# Patient Record
Sex: Female | Born: 1975 | Race: White | Hispanic: No | Marital: Married | Smoking: Never smoker
Health system: Southern US, Community
[De-identification: ages and names within clinical notes are randomized; demographics above are authoritative.]

## PROBLEM LIST (undated history)

## (undated) DIAGNOSIS — F32A Depression, unspecified: Secondary | ICD-10-CM

## (undated) DIAGNOSIS — N6009 Solitary cyst of unspecified breast: Secondary | ICD-10-CM

## (undated) DIAGNOSIS — F419 Anxiety disorder, unspecified: Secondary | ICD-10-CM

## (undated) DIAGNOSIS — F329 Major depressive disorder, single episode, unspecified: Secondary | ICD-10-CM

## (undated) HISTORY — PX: BREAST CYST ASPIRATION: SHX578

## (undated) HISTORY — DX: Anxiety disorder, unspecified: F41.9

## (undated) HISTORY — DX: Solitary cyst of unspecified breast: N60.09

## (undated) HISTORY — DX: Depression, unspecified: F32.A

## (undated) HISTORY — DX: Major depressive disorder, single episode, unspecified: F32.9

---

## 2008-03-12 ENCOUNTER — Other Ambulatory Visit: Admission: RE | Admit: 2008-03-12 | Discharge: 2008-03-12 | Payer: Self-pay | Admitting: Family Medicine

## 2008-03-12 ENCOUNTER — Ambulatory Visit: Payer: Self-pay | Admitting: Family Medicine

## 2008-03-12 ENCOUNTER — Encounter: Payer: Self-pay | Admitting: Family Medicine

## 2008-03-12 DIAGNOSIS — F329 Major depressive disorder, single episode, unspecified: Secondary | ICD-10-CM

## 2008-04-03 ENCOUNTER — Ambulatory Visit: Payer: Self-pay | Admitting: Family Medicine

## 2008-04-03 DIAGNOSIS — B351 Tinea unguium: Secondary | ICD-10-CM

## 2008-05-13 ENCOUNTER — Encounter: Payer: Self-pay | Admitting: Family Medicine

## 2008-05-16 ENCOUNTER — Encounter: Payer: Self-pay | Admitting: Family Medicine

## 2009-04-29 ENCOUNTER — Encounter: Payer: Self-pay | Admitting: Family Medicine

## 2010-05-04 ENCOUNTER — Encounter: Payer: Self-pay | Admitting: Family Medicine

## 2010-12-06 NOTE — Letter (Signed)
[facility]   Imported By: Lanelle Bal 05/12/2010 13:24:30  _____________________________________________________________________  External Attachment:    Type:   Image     Comment:   External Document

## 2011-01-23 ENCOUNTER — Encounter: Payer: Self-pay | Admitting: Family Medicine

## 2011-01-24 ENCOUNTER — Encounter: Payer: Self-pay | Admitting: Family Medicine

## 2011-01-24 ENCOUNTER — Ambulatory Visit (INDEPENDENT_AMBULATORY_CARE_PROVIDER_SITE_OTHER): Payer: Self-pay | Admitting: Family Medicine

## 2011-01-24 ENCOUNTER — Ambulatory Visit: Payer: Self-pay | Admitting: Family Medicine

## 2011-01-24 VITALS — BP 112/67 | HR 64 | Ht 67.0 in | Wt 186.0 lb

## 2011-01-24 DIAGNOSIS — F329 Major depressive disorder, single episode, unspecified: Secondary | ICD-10-CM

## 2011-01-24 DIAGNOSIS — T148XXA Other injury of unspecified body region, initial encounter: Secondary | ICD-10-CM

## 2011-01-24 MED ORDER — VENLAFAXINE HCL ER 37.5 MG PO CP24: 37.5000 mg | ORAL_CAPSULE | Freq: Every day | ORAL | Status: DC

## 2011-01-24 NOTE — Progress Notes (Signed)
  Subjective:    Patient ID: LEGEND PECORE, female    DOB: 1976-07-30, 35 y.o.   MRN: [id number]  HPI  35yo WF presents for a fall down the stairs at home 1 wk ago.  She tripped over her pant leg, falling about 4 steps down, striking her R hip on the stairs.  Denies any head trauma or LOC.  Since then, she has had mild pain over the R femur/ hip with a large bruise.  Taking tyelnol and using ice.  Able to bear weight and walk w/o much pain.  Has some mild aching while sitting.    She also is interested in changing her fluoxetine back to cymbalta for her mood which was changed while she was pregnant 2.5 yrs ago.  She feels like the fluoxetine does not work as well but she is concerned about the cost for cymbalta.  Home life stable.  She is not breastfeeding.      Review of Systems  Musculoskeletal: Negative for gait problem.  Neurological: Negative for weakness and numbness.  Psychiatric/Behavioral: Negative for dysphoric mood.       Objective:   Physical Exam  Constitutional: She appears well-developed and well-nourished.  HENT:  Head: Normocephalic and atraumatic.  Neck: Neck supple.  Cardiovascular: Normal rate and regular rhythm.   Pulmonary/Chest: Effort normal and breath sounds normal. No respiratory distress.  Musculoskeletal:       Right hip: Normal.  Skin:          Large bruise w/ hematoma lateral/ posterior R proximal leg  Psychiatric: She has a normal mood and affect.          Assessment & Plan:  1. Bruise R hip/ leg x 1 wk after fall down the stairs.  Bruise appears to be healing and there is no sign of a hematoma. Full ROM w/o pain.  Will treat for soft tissue injury with ice and ibuprofen this week.  Expect to see improvements over the next week.    2.  Depression- She agrees to try generic effexor xr for mood due to cost savings from cymbalta and wanting to get back on SNRI which she did so well on in the past.  Start at 34.5 mg/ day x 1 wk then go up to 75 mg/  day.  Call if any problems.  RTC in 2 mos for f/u.

## 2011-01-24 NOTE — Patient Instructions (Signed)
Ice and Ibuprofen for bruise on R hip. This will improve over the next 10 days.  Start Venlaxafine 37.5 mg every AM x 7 days then go up to 2 capsules every morning.  Return for f/u mood in 6-8 wks.

## 2011-06-14 ENCOUNTER — Ambulatory Visit (INDEPENDENT_AMBULATORY_CARE_PROVIDER_SITE_OTHER): Payer: BC Managed Care – HMO | Admitting: Family Medicine

## 2011-06-14 ENCOUNTER — Other Ambulatory Visit (HOSPITAL_COMMUNITY)
Admission: RE | Admit: 2011-06-14 | Discharge: 2011-06-14 | Disposition: A | Discharge status: Home / Self Care | Payer: BC Managed Care – HMO | Source: Ambulatory Visit | Attending: Family Medicine | Admitting: Family Medicine

## 2011-06-14 ENCOUNTER — Encounter: Payer: Self-pay | Admitting: Family Medicine

## 2011-06-14 VITALS — BP 107/70 | HR 64 | Wt 179.0 lb

## 2011-06-14 DIAGNOSIS — Z0000 Encounter for general adult medical examination without abnormal findings: Secondary | ICD-10-CM

## 2011-06-14 DIAGNOSIS — Z01419 Encounter for gynecological examination (general) (routine) without abnormal findings: Secondary | ICD-10-CM | POA: Insufficient documentation

## 2011-06-14 MED ORDER — VENLAFAXINE HCL ER 150 MG PO CP24: 150.0000 mg | ORAL_CAPSULE | Freq: Every day | ORAL | Status: DC

## 2011-06-14 MED ORDER — TETANUS-DIPHTH-ACELL PERTUSSIS 5-2.5-18.5 LF-MCG/0.5 IM SUSP: 0.5000 mL | Freq: Once | INTRAMUSCULAR | Status: DC

## 2011-06-14 NOTE — Progress Notes (Signed)
  Subjective:    Patient ID: Amanda Ferrell, female    DOB: January 26, 1976, 35 y.o.   MRN: [id number]  HPI  35 yo WF presents for her annual PAP. She was previously seen by [facility].  No hx of abnormal pap.  She is married, monogamous.  She stopped her OCPs last month  Because her husband had a vasectomy.  She used to have heavy periods and bad cramping.  No fam hx of colon or breast cancer.  She does have fam hx of heart dz in her grandparents.  She has fasting labs done thru work next wk.  She is taking Venlafaxine for her mood because Cymbalta was too expensive.    BP 107/70  Pulse 64  Wt 179 lb (81.194 kg)  LMP 05/24/2011  Review of Systems  Constitutional: Negative for fatigue and unexpected weight change.  Eyes: Negative for visual disturbance.  Respiratory: Negative for shortness of breath.   Cardiovascular: Negative for chest pain, palpitations and leg swelling.  Gastrointestinal: Negative for diarrhea, constipation and blood in stool.  Genitourinary: Negative for difficulty urinating.  Psychiatric/Behavioral: Negative for sleep disturbance and dysphoric mood. The patient is not nervous/anxious.        Objective:   Physical Exam  Constitutional: She appears well-developed and well-nourished. No distress.       overwt  HENT:  Mouth/Throat: Oropharynx is clear and moist.  Neck: Neck supple. No thyromegaly present.  Cardiovascular: Normal rate, regular rhythm and normal heart sounds.   No murmur heard. Pulmonary/Chest: Effort normal and breath sounds normal. Right breast exhibits no mass, no nipple discharge and no skin change. Left breast exhibits no mass, no nipple discharge and no skin change.  Abdominal: Soft. Bowel sounds are normal. There is no tenderness.  Genitourinary: Uterus normal. Cervix exhibits no motion tenderness, no discharge and no friability. Right adnexum displays no mass. Left adnexum displays no mass and no tenderness.  Musculoskeletal: She exhibits no  edema.  Skin: Skin is warm and dry.  Psychiatric: She has a normal mood and affect.          Assessment & Plan:  CPE with pap:  F/u thin prep results next wk. Tdap updated today. Getting fasting labs thru work.  Will get a copy. Will go up on her venlafaxine dose and f/u for mood in 2 mos Work on [employer], regular exercise, MVI daily.

## 2011-06-14 NOTE — Patient Instructions (Signed)
Tdap today.  Will call you with pap smear results in the next week.  Fax or bring by a copy of your fasting labs you are doing this year.  Increase Venlafaxine to 150 mg daily.  Return for f/u mood in 2 mos.

## 2011-06-16 ENCOUNTER — Telehealth: Payer: Self-pay | Admitting: Family Medicine

## 2011-06-16 NOTE — Telephone Encounter (Signed)
Pls let pt know that her pap smear came back normal.  Repeat in 2 yrs. 

## 2011-06-19 NOTE — Telephone Encounter (Signed)
Pt aware of the above  

## 2011-06-28 ENCOUNTER — Telehealth: Payer: Self-pay | Admitting: Family Medicine

## 2011-06-28 MED ORDER — VENLAFAXINE HCL ER 75 MG PO CP24: 75.0000 mg | ORAL_CAPSULE | Freq: Every day | ORAL | Status: DC

## 2011-06-28 NOTE — Telephone Encounter (Signed)
Sure, if she felt better on 75 mg, will go ahead and fill this dose for her.

## 2011-06-28 NOTE — Telephone Encounter (Signed)
Pt called, but had to [facility] for her telling her that the effexor XR was decreased back down to the 75 mg, and to call the pharm later today to see if they have it ready for her to pup. Jarvis Newcomer, LPN [facility]

## 2011-06-28 NOTE — Telephone Encounter (Signed)
Pt called and said she was seen two weeks ago and they renewed her script for effexor XR but increased the dose to 150 mg. Pt is calling wanting to know if she can go back to using the 75 mg?  Please advise. Routed to Dr. Arlice Colt, LPN [facility]

## 2011-06-30 ENCOUNTER — Encounter: Payer: Self-pay | Admitting: Family Medicine

## 2011-06-30 ENCOUNTER — Ambulatory Visit (INDEPENDENT_AMBULATORY_CARE_PROVIDER_SITE_OTHER): Payer: BC Managed Care – HMO | Admitting: Family Medicine

## 2011-06-30 DIAGNOSIS — J069 Acute upper respiratory infection, unspecified: Secondary | ICD-10-CM

## 2011-06-30 DIAGNOSIS — F329 Major depressive disorder, single episode, unspecified: Secondary | ICD-10-CM

## 2011-06-30 MED ORDER — DULOXETINE HCL 30 MG PO CPEP: 30.0000 mg | ORAL_CAPSULE | Freq: Every day | ORAL | Status: DC

## 2011-06-30 NOTE — Patient Instructions (Signed)
Take your effexor every other day for 10 days.  In 5 days can start the cymbalta.  Call me if not better in 5 days.

## 2011-06-30 NOTE — Progress Notes (Signed)
  Subjective:    Patient ID: Amanda Ferrell, female    DOB: Oct 22, 1976, 35 y.o.   MRN: [id number]  HPI  sinsus pain and pressure for 5 days. Mild ST.  Lots of nasal congestion. No cough. No fever. No ear pain or pressure.  Teeth hurting.  Some sinus pressure.  Taking Tylenol sinus and helps some.    Recently switched to effexot. Used to be on cymbalta before got pregnant and it worked well but once pregnant was put on fluoxetine. After her pregnancy she felt it wasn't working well to changed to [employer]. She has been having more frequent HA on this and wonder is can go back to cymbalta.  Review of Systems     Objective:   Physical Exam  Constitutional: She is oriented to person, place, and time. She appears well-developed and well-nourished.  HENT:  Head: Normocephalic and atraumatic.  Right Ear: External ear normal.  Left Ear: External ear normal.  Nose: Nose normal.  Mouth/Throat: Oropharynx is clear and moist.       TMs and canals are clear.   Eyes: Conjunctivae and EOM are normal. Pupils are equal, round, and reactive to light.  Neck: Neck supple. No thyromegaly present.  Cardiovascular: Normal rate, regular rhythm and normal heart sounds.   Pulmonary/Chest: Effort normal and breath sounds normal. She has no wheezes.  Lymphadenopathy:    She has no cervical adenopathy.  Neurological: She is alert and oriented to person, place, and time.  Skin: Skin is warm and dry.  Psychiatric: She has a normal mood and affect.          Assessment & Plan:  Acute sinusitis - likely viral. reviwed with pt. H.o given on treatments that work. Call if not better in 5 days

## 2011-06-30 NOTE — Assessment & Plan Note (Signed)
Discussed options. She would like to change back to cymbalta. Will wean effexor and start the cymbalta. F/U in 4 -5 weeks and then can adjust dose etc.  She did really well on the cymbalta in the pst.

## 2011-07-04 ENCOUNTER — Telehealth: Payer: Self-pay | Admitting: Family Medicine

## 2011-07-04 MED ORDER — AZITHROMYCIN 250 MG PO TABS: ORAL_TABLET | ORAL | Status: AC

## 2011-07-04 NOTE — Telephone Encounter (Signed)
Pt called and said she was seen last Friday and at that point had been sick for 7 days.  Was told she had a sinus infection but because it had not been 10 days was not given a antibiotic Tx.  Was told to call back if she did not improve.  Pt still complaining of +pressure, +green nasal, and head congestion, ST, stuffy head. Allergic to PCN . PLAN:  Routed this information to Dr. Linford Arnold since the office note states for the pt to call in 5 days if not better. Jarvis Newcomer, LPN [facility]

## 2011-07-04 NOTE — Telephone Encounter (Signed)
Rx sent to [facility]

## 2011-07-04 NOTE — Telephone Encounter (Signed)
Pt notified that Zithromax prescription was sent to her pharmacy. Jarvis Newcomer, LPN [facility]

## 2011-07-20 ENCOUNTER — Encounter: Payer: Self-pay | Admitting: Family Medicine

## 2011-08-04 ENCOUNTER — Ambulatory Visit: Payer: BC Managed Care – HMO | Admitting: Family Medicine

## 2011-08-10 ENCOUNTER — Ambulatory Visit: Payer: BC Managed Care – HMO | Admitting: Family Medicine

## 2011-08-10 DIAGNOSIS — Z0289 Encounter for other administrative examinations: Secondary | ICD-10-CM

## 2011-11-21 ENCOUNTER — Encounter: Payer: Self-pay | Admitting: Family Medicine

## 2011-11-21 ENCOUNTER — Ambulatory Visit (INDEPENDENT_AMBULATORY_CARE_PROVIDER_SITE_OTHER): Payer: BC Managed Care – HMO | Admitting: Family Medicine

## 2011-11-21 VITALS — BP 112/78 | HR 82 | Temp 99.5°F | Wt 190.0 lb

## 2011-11-21 DIAGNOSIS — F329 Major depressive disorder, single episode, unspecified: Secondary | ICD-10-CM

## 2011-11-21 DIAGNOSIS — J329 Chronic sinusitis, unspecified: Secondary | ICD-10-CM

## 2011-11-21 MED ORDER — SERTRALINE HCL 50 MG PO TABS: ORAL_TABLET | ORAL | Status: DC

## 2011-11-21 MED ORDER — AZITHROMYCIN 250 MG PO TABS: ORAL_TABLET | ORAL | Status: AC

## 2011-11-21 NOTE — Patient Instructions (Signed)

## 2011-11-21 NOTE — Progress Notes (Signed)
  Subjective:    Patient ID: Amanda Ferrell, female    DOB: 1976-06-09, 36 y.o.   MRN: [id number]  HPI 6 days of sneezing and nasal congestion.  Seh wonders if may be allergies.  Throat and nose is itchy. A lot of facila pressure.  +post nasal drip. Nauseated.  Did start zyrtec a couple of times.  No alleviating sxs. Taking sinus OTC medication. Using cough syrup.    Depression - Switched herself back to effexor from the cymbalta since the cost when up..  But now when misses her dose she feels really dizzy.    Review of Systems     Objective:   Physical Exam  Constitutional: She is oriented to person, place, and time. She appears well-developed and well-nourished.  HENT:  Head: Normocephalic and atraumatic.  Right Ear: External ear normal.  Left Ear: External ear normal.  Nose: Nose normal.  Mouth/Throat: Oropharynx is clear and moist.       Both TMS are blocked by cerumen.   Eyes: Conjunctivae and EOM are normal. Pupils are equal, round, and reactive to light.  Neck: Neck supple. No thyromegaly present.  Cardiovascular: Normal rate, regular rhythm and normal heart sounds.   Pulmonary/Chest: Effort normal and breath sounds normal. She has no wheezes.  Lymphadenopathy:    She has no cervical adenopathy.  Neurological: She is alert and oriented to person, place, and time.  Skin: Skin is warm and dry.  Psychiatric: She has a normal mood and affect.          Assessment & Plan:  Depression - She was to get off the Effexor because of side effects when she accidentally misses a dose. She really likes the Cymbalta but can't afford the copayment. Will change her to sertraline. Followup in 3 or 4 weeks to see how she's doing on the medication. PHQ- 9 score of 2.    Sinusitis and possibly AR - will start zyrtec and veramyst treat for allergies. I will have her on a Z-Pak as well as she may have a secondary bacterial sinusitis. After she completes the antibiotic I would like her to  continue the allergy medications and see how well she is doing in 3-4 weeks.

## 2011-12-20 ENCOUNTER — Encounter: Payer: Self-pay | Admitting: *Deleted

## 2011-12-25 ENCOUNTER — Ambulatory Visit: Payer: BC Managed Care – HMO | Admitting: Family Medicine

## 2011-12-29 ENCOUNTER — Encounter: Payer: Self-pay | Admitting: *Deleted

## 2012-01-02 ENCOUNTER — Ambulatory Visit: Payer: BC Managed Care – HMO | Admitting: Family Medicine

## 2012-01-08 ENCOUNTER — Encounter: Payer: Self-pay | Admitting: Family Medicine

## 2012-01-08 ENCOUNTER — Ambulatory Visit (INDEPENDENT_AMBULATORY_CARE_PROVIDER_SITE_OTHER): Payer: BC Managed Care – HMO | Admitting: Family Medicine

## 2012-01-08 VITALS — BP 107/66 | HR 84 | Ht 68.0 in | Wt 197.0 lb

## 2012-01-08 DIAGNOSIS — N644 Mastodynia: Secondary | ICD-10-CM

## 2012-01-08 DIAGNOSIS — F329 Major depressive disorder, single episode, unspecified: Secondary | ICD-10-CM

## 2012-01-08 MED ORDER — SERTRALINE HCL 50 MG PO TABS: 50.0000 mg | ORAL_TABLET | Freq: Every day | ORAL | Status: DC

## 2012-01-08 MED ORDER — NORETHIN ACE-ETH ESTRAD-FE 1.5-30 MG-MCG PO TABS: 1.0000 | ORAL_TABLET | Freq: Every day | ORAL | Status: DC

## 2012-01-08 NOTE — Progress Notes (Signed)
  Subjective:    Patient ID: Amanda Ferrell, female    DOB: 11-23-1975, 36 y.o.   MRN: [id number]  HPI Depression f/u from 6 weeks ago. We switched her to sertraline. Doing well on it, no SE. Says wants to stay on current dose. Happy with it. Sleeping well. Says still has decreased pleasure in dong things.   She would also like to restart her birth control. She says she's had more symptoms as far as her periods, breast tenderness and feeling discomfort with ovulation. She came off of it about a year ago her husband had vasectomy but that she would like to get back on it for symptom control. She would like to start again this Sunday since she starts her period this week.  Review of Systems     Objective:   Physical Exam  Constitutional: She is oriented to person, place, and time. She appears well-developed and well-nourished.  HENT:  Head: Normocephalic and atraumatic.  Cardiovascular: Normal rate, regular rhythm and normal heart sounds.   Pulmonary/Chest: Effort normal and breath sounds normal.  Neurological: She is alert and oriented to person, place, and time.  Skin: Skin is warm and dry.  Psychiatric: She has a normal mood and affect. Her behavior is normal.          Assessment & Plan:  Depression - PHQ-9 score of 5 today, up from 2 compared to cymblata.  She is o/w doing well. Continue current regimen per her preference.  F/U in 3 months. Mail order rx sent.   Restart OCps for sxs control.

## 2012-04-09 ENCOUNTER — Ambulatory Visit: Payer: BC Managed Care – HMO | Admitting: Family Medicine

## 2012-04-23 ENCOUNTER — Ambulatory Visit: Payer: BC Managed Care – HMO | Admitting: Family Medicine

## 2013-05-13 ENCOUNTER — Encounter: Payer: Self-pay | Admitting: Family Medicine

## 2013-05-13 ENCOUNTER — Ambulatory Visit (INDEPENDENT_AMBULATORY_CARE_PROVIDER_SITE_OTHER): Payer: BC Managed Care – HMO | Admitting: Family Medicine

## 2013-05-13 VITALS — BP 121/80 | HR 71 | Wt 201.0 lb

## 2013-05-13 DIAGNOSIS — Z309 Encounter for contraceptive management, unspecified: Secondary | ICD-10-CM

## 2013-05-13 DIAGNOSIS — Z0000 Encounter for general adult medical examination without abnormal findings: Secondary | ICD-10-CM

## 2013-05-13 DIAGNOSIS — IMO0001 Reserved for inherently not codable concepts without codable children: Secondary | ICD-10-CM

## 2013-05-13 DIAGNOSIS — R21 Rash and other nonspecific skin eruption: Secondary | ICD-10-CM

## 2013-05-13 MED ORDER — NORETHIN ACE-ETH ESTRAD-FE 1.5-30 MG-MCG PO TABS: 1.0000 | ORAL_TABLET | Freq: Every day | ORAL | Status: DC

## 2013-05-13 NOTE — Patient Instructions (Signed)
Keep up a regular exercise program and make sure you are eating a healthy diet Try to eat 4 servings of dairy a day, or if you are lactose intolerant take a calcium with vitamin D daily.  Your vaccines are up to date.   

## 2013-05-13 NOTE — Progress Notes (Signed)
Subjective:     Amanda Ferrell is a 37 y.o. female and is here for a comprehensive physical exam. The patient reports problems - she would like to get back on birth control. last pap was last year and was normal. . She would like to restart birth control. Her husband had a vasectomy but she feels like when she was on birth control and help regulate her cycles and she did have a lot of premenstrual discomfort and bloating and cramping. She is tolerated it well the past without any side effects or problems.  She's also noticed a rash on the palm of her right hand. Has been there on and off for months. She says it will look like a bubble underneath the skin in that it would itch like crazy. She's been trying to moisturize it thinking it was some type of eczema. She denies any irritation or recurrent contact with chemicals et Karie Soda. She demonstrated persistent for a living. She is right handed.  History   Social History  . Marital Status: Married    Spouse Name: N/A    Number of Children: N/A  . Years of Education: N/A   Occupational History  . Not on file.   Social History Main Topics  . Smoking status: Never Smoker   . Smokeless tobacco: Not on file  . Alcohol Use: Yes     Comment: occasional  . Drug Use: No  . Sexually Active:    Other Topics Concern  . Not on file   Social History Narrative  . No narrative on file   Health Maintenance  Topic Date Due  . Influenza Vaccine  07/07/2013  . Pap Smear  06/13/2014  . Tetanus/tdap  06/13/2021    The following portions of the patient's history were reviewed and updated as appropriate: allergies, current medications, past family history, past medical history, past social history, past surgical history and problem list.  Review of Systems A comprehensive review of systems was negative.   Objective:    BP 121/80  Pulse 71  Wt 201 lb (91.173 kg)  BMI 30.57 kg/m2 General appearance: alert, cooperative and appears stated  age Head: Normocephalic, without obvious abnormality, atraumatic Eyes: conj clear, EOMi, PEERLA Ears: normal TM's and external ear canals both ears Nose: Nares normal. Septum midline. Mucosa normal. No drainage or sinus tenderness. Throat: lips, mucosa, and tongue normal; teeth and gums normal Neck: no adenopathy, no JVD, supple, symmetrical, trachea midline and thyroid not enlarged, symmetric, no tenderness/mass/nodules Back: symmetric, no curvature. ROM normal. No CVA tenderness. Lungs: clear to auscultation bilaterally Breasts: normal appearance, no masses or tenderness Heart: regular rate and rhythm, S1, S2 normal, no murmur, click, rub or gallop Abdomen: soft, non-tender; bowel sounds normal; no masses,  no organomegaly Extremities: extremities normal, atraumatic, no cyanosis or edema Pulses: 2+ and symmetric Skin: she has a clear vesicle with dry peeling skin at the center of the palm of the right hand.  Lymph nodes: Cervical, supraclavicular, and axillary nodes normal. Neurologic: Alert and oriented X 3, normal strength and tone. Normal symmetric reflexes. Normal coordination and gait    Assessment:    Healthy female exam.    Plan:     See After Visit Summary for Counseling Recommendations  Keep up a regular exercise program and make sure you are eating a healthy diet Try to eat 4 servings of dairy a day, or if you are lactose intolerant take a calcium with vitamin D daily.  Your vaccines are  up to date.   Keep up a regular exercise program and make sure you are eating a healthy diet  Rash on the palm of her right hand-most consistent with dyshidrotic eczema. Most likely will respond well to topical steroid cream. I did do a skin scraping to rule out any fungal elements first and if that is negative then I will send in a topical betamethasone ointment for her. Continue to keep it well moisturized.  Contraception-we'll restart birth control to control her cycles and  perimenstrual symptoms. Try to eat 4 servings of dairy a day, or if you are lactose intolerant take a calcium with vitamin D daily.  Your vaccines are up to date.

## 2013-05-14 ENCOUNTER — Other Ambulatory Visit: Payer: Self-pay | Admitting: Family Medicine

## 2013-05-14 MED ORDER — BETAMETHASONE DIPROPIONATE 0.05 % EX OINT: TOPICAL_OINTMENT | Freq: Two times a day (BID) | CUTANEOUS | Status: DC

## 2013-05-16 LAB — COMPLETE METABOLIC PANEL WITH GFR
AST: 16 U/L (ref 0–37)
Alkaline Phosphatase: 62 U/L (ref 39–117)
BUN: 11 mg/dL (ref 6–23)
Creat: 0.7 mg/dL (ref 0.50–1.10)
GFR, Est Non African American: >89 mL/min
Glucose, Bld: 88 mg/dL (ref 70–99)

## 2013-05-16 LAB — LIPID PANEL
Cholesterol: 168 mg/dL (ref 0–200)
Total CHOL/HDL Ratio: 3.1 Ratio
Triglycerides: 61 mg/dL (ref <150)
VLDL: 12 mg/dL (ref 0–40)

## 2013-05-16 NOTE — Progress Notes (Signed)
Quick Note:  All labs are normal. ______ 

## 2013-09-19 ENCOUNTER — Encounter: Payer: Self-pay | Admitting: Sports Medicine

## 2013-09-19 ENCOUNTER — Ambulatory Visit (INDEPENDENT_AMBULATORY_CARE_PROVIDER_SITE_OTHER): Payer: BC Managed Care – HMO | Admitting: Sports Medicine

## 2013-09-19 VITALS — BP 135/85 | HR 106 | Wt 204.0 lb

## 2013-09-19 DIAGNOSIS — N390 Urinary tract infection, site not specified: Secondary | ICD-10-CM

## 2013-09-19 DIAGNOSIS — N3090 Cystitis, unspecified without hematuria: Secondary | ICD-10-CM

## 2013-09-19 LAB — POCT URINALYSIS DIPSTICK
Bilirubin, UA: NEGATIVE
Glucose, UA: NEGATIVE
Ketones, UA: NEGATIVE
Nitrite, UA: NEGATIVE
Spec Grav, UA: 1.025
Urobilinogen, UA: 0.2
pH, UA: 6.5

## 2013-09-19 MED ORDER — CEPHALEXIN 500 MG PO CAPS: 500.0000 mg | ORAL_CAPSULE | Freq: Two times a day (BID) | ORAL | Status: DC

## 2013-09-19 MED ORDER — PHENAZOPYRIDINE HCL 200 MG PO TABS: 200.0000 mg | ORAL_TABLET | Freq: Three times a day (TID) | ORAL | Status: AC

## 2013-09-19 NOTE — Assessment & Plan Note (Signed)
Urine culture, Keflex (non-anaphylactic response to penicillin as a child), Pyridium. Return as needed.

## 2013-09-19 NOTE — Patient Instructions (Signed)
Urinary Tract Infection  Urinary tract infections (UTIs) can develop anywhere along your urinary tract. Your urinary tract is your body's drainage system for removing wastes and extra water. Your urinary tract includes two kidneys, two ureters, a bladder, and a urethra. Your kidneys are a pair of bean-shaped organs. Each kidney is about the size of your fist. They are located below your ribs, one on each side of your spine.  CAUSES  Infections are caused by microbes, which are microscopic organisms, including fungi, viruses, and bacteria. These organisms are so small that they can only be seen through a microscope. Bacteria are the microbes that most commonly cause UTIs.  SYMPTOMS   Symptoms of UTIs may vary by age and gender of the patient and by the location of the infection. Symptoms in young women typically include a frequent and intense urge to urinate and a painful, burning feeling in the bladder or urethra during urination. Older women and men are more likely to be tired, shaky, and weak and have muscle aches and abdominal pain. A fever may mean the infection is in your kidneys. Other symptoms of a kidney infection include pain in your back or sides below the ribs, nausea, and vomiting.  DIAGNOSIS  To diagnose a UTI, your caregiver will ask you about your symptoms. Your caregiver also will ask to provide a urine sample. The urine sample will be tested for bacteria and white blood cells. White blood cells are made by your body to help fight infection.  TREATMENT   Typically, UTIs can be treated with medication. Because most UTIs are caused by a bacterial infection, they usually can be treated with the use of antibiotics. The choice of antibiotic and length of treatment depend on your symptoms and the type of bacteria causing your infection.  HOME CARE INSTRUCTIONS   If you were prescribed antibiotics, take them exactly as your caregiver instructs you. Finish the medication even if you feel better after you  have only taken some of the medication.   Drink enough water and fluids to keep your urine clear or pale yellow.   Avoid caffeine, tea, and carbonated beverages. They tend to irritate your bladder.   Empty your bladder often. Avoid holding urine for long periods of time.   Empty your bladder before and after sexual intercourse.   After a bowel movement, women should cleanse from front to back. Use each tissue only once.  SEEK MEDICAL CARE IF:    You have back pain.   You develop a fever.   Your symptoms do not begin to resolve within 3 days.  SEEK IMMEDIATE MEDICAL CARE IF:    You have severe back pain or lower abdominal pain.   You develop chills.   You have nausea or vomiting.   You have continued burning or discomfort with urination.  MAKE SURE YOU:    Understand these instructions.   Will watch your condition.   Will get help right away if you are not doing well or get worse.  Document Released: 08/02/2005 Document Revised: 04/23/2012 Document Reviewed: 12/01/2011  [employer] Patient Information 2014 [employer].

## 2013-09-19 NOTE — Progress Notes (Signed)
  Subjective:    CC: Dysuria  HPI: This pleasant 37 year old female comes in with a several day history of burning when she urinates, urgency, frequency, no fevers, chills, no flank pain. No rashes. No vaginal discharge. Symptoms are moderate, persistent.  Past medical history, Surgical history, Family history not pertinant except as noted below, Social history, Allergies, and medications have been entered into the medical record, reviewed, and no changes needed.   Review of Systems: No fevers, chills, night sweats, weight loss, chest pain, or shortness of breath.   Objective:    General: Well Developed, well nourished, and in no acute distress.  Neuro: Alert and oriented x3, extra-ocular muscles intact, sensation grossly intact.  HEENT: Normocephalic, atraumatic, pupils equal round reactive to light, neck supple, no masses, no lymphadenopathy, thyroid nonpalpable.  Skin: Warm and dry, no rashes. Cardiac: Regular rate and rhythm, no murmurs rubs or gallops, no lower extremity edema.  Respiratory: Clear to auscultation bilaterally. Not using accessory muscles, speaking in full sentences. Abdomen: Soft, minimally tender to palpation over the suprapubic region, no costovertebral angle pain.  Urinalysis was reviewed and shows leukocytes and blood.  Impression and Recommendations:

## 2013-09-22 LAB — URINE CULTURE: Culture: 75000

## 2013-11-18 ENCOUNTER — Encounter: Payer: Self-pay | Admitting: Physician Assistant

## 2013-11-18 ENCOUNTER — Ambulatory Visit (INDEPENDENT_AMBULATORY_CARE_PROVIDER_SITE_OTHER): Payer: Managed Care, Other (non HMO) | Admitting: Physician Assistant

## 2013-11-18 VITALS — BP 121/81 | HR 90 | Temp 98.8°F | Wt 206.0 lb

## 2013-11-18 DIAGNOSIS — H6692 Otitis media, unspecified, left ear: Secondary | ICD-10-CM

## 2013-11-18 DIAGNOSIS — H6123 Impacted cerumen, bilateral: Secondary | ICD-10-CM

## 2013-11-18 DIAGNOSIS — J019 Acute sinusitis, unspecified: Secondary | ICD-10-CM

## 2013-11-18 DIAGNOSIS — H6690 Otitis media, unspecified, unspecified ear: Secondary | ICD-10-CM

## 2013-11-18 DIAGNOSIS — H6120 Impacted cerumen, unspecified ear: Secondary | ICD-10-CM

## 2013-11-18 MED ORDER — FLUTICASONE PROPIONATE 50 MCG/ACT NA SUSP: 2.0000 | Freq: Every day | NASAL | Status: DC

## 2013-11-18 MED ORDER — CEFDINIR 300 MG PO CAPS: 300.0000 mg | ORAL_CAPSULE | Freq: Two times a day (BID) | ORAL | Status: DC

## 2013-11-18 NOTE — Patient Instructions (Signed)

## 2013-11-18 NOTE — Progress Notes (Signed)
   Subjective:    Patient ID: Amanda Ferrell, female    DOB: December 26, 1975, 38 y.o.   MRN: [id number]  HPI Patient is a 38 year old female who presents to the clinic with sore throat, headache, nasal congestion and fever for the last 2 days. She was sick around Christmas with sinus pressure symptoms. They eventually went away with symptomatic care. She had a week of relief and she started feeling her congestion, back. Last night it reached a peak and she started having body aches, chills and a temperature of 100.9. She feels a lot of ear pressure. She does have some sore throat. She feels a lot of drainage coming down to the back of her throat. She has not tried anything but ibuprofen and Tylenol for fever. She denies any significant cough or wheezing. She does feel like her chest is tight.    Review of Systems     Objective:   Physical Exam  HENT:  Head: Normocephalic and atraumatic.  Bilateral ears were impacted with cerumen. After irrigation and significant cerumen removal I was still unable to view TMs and right ear due to impaction and only had a partial view of TM in left ear. From what I did see in left ear seemed goal with significant fluid behind and erythema surrounding. There was significant discharge that appears white and frothy coming from lack ear. It is possible some of this was 2 to cerumen.  Oropharynx was erythematous with post nasal drip present.  Maxillary tenderness to palpation bilaterally.  Eyes: Conjunctivae are normal. Right eye exhibits no discharge. Left eye exhibits no discharge.  Neck: Normal range of motion. Neck supple.  Bilateral anterior cervical lymph nodes were enlarged but not tender to palpation.  Cardiovascular: Normal rate, regular rhythm and normal heart sounds.   Pulmonary/Chest: Effort normal and breath sounds normal. She has no wheezes.  Skin: Skin is warm and dry.  Psychiatric: She has a normal mood and affect. Her behavior is normal.          Assessment & Plan:  Left otitis media/sinusitis/bilateral cerumen impaction-I decided to treat with Omnicef today, where saturated of of penicillin however patient is unable to recall penicillin allergy as it happened as a young child. She was instructed that if she developed any rash or unusual symptoms to take the Benadryl stop medication and call office. I also sent Flonase to clinic to use daily as needed. Patient had significant cerumen in the ears today. Instructed patient that the ear irrigation today hopefully will help to loosen as well as using the Debrox drops to get out. If unable to clean ears out we can send to ENT for removal.

## 2014-04-30 ENCOUNTER — Other Ambulatory Visit: Payer: Self-pay | Admitting: Family Medicine

## 2014-05-25 ENCOUNTER — Encounter: Payer: Managed Care, Other (non HMO) | Admitting: Family Medicine

## 2014-06-18 ENCOUNTER — Ambulatory Visit (INDEPENDENT_AMBULATORY_CARE_PROVIDER_SITE_OTHER): Payer: Managed Care, Other (non HMO) | Admitting: Family Medicine

## 2014-06-18 ENCOUNTER — Other Ambulatory Visit (HOSPITAL_COMMUNITY)
Admission: RE | Admit: 2014-06-18 | Discharge: 2014-06-18 | Disposition: A | Discharge status: Home / Self Care | Payer: Managed Care, Other (non HMO) | Source: Ambulatory Visit | Attending: Family Medicine | Admitting: Family Medicine

## 2014-06-18 ENCOUNTER — Encounter: Payer: Self-pay | Admitting: Family Medicine

## 2014-06-18 VITALS — BP 116/66 | HR 74 | Ht 68.0 in | Wt 203.0 lb

## 2014-06-18 DIAGNOSIS — Z01419 Encounter for gynecological examination (general) (routine) without abnormal findings: Secondary | ICD-10-CM | POA: Diagnosis not present

## 2014-06-18 DIAGNOSIS — H6120 Impacted cerumen, unspecified ear: Secondary | ICD-10-CM

## 2014-06-18 DIAGNOSIS — Z1151 Encounter for screening for human papillomavirus (HPV): Secondary | ICD-10-CM | POA: Insufficient documentation

## 2014-06-18 DIAGNOSIS — H6123 Impacted cerumen, bilateral: Secondary | ICD-10-CM

## 2014-06-18 DIAGNOSIS — Z0000 Encounter for general adult medical examination without abnormal findings: Secondary | ICD-10-CM

## 2014-06-18 DIAGNOSIS — Z124 Encounter for screening for malignant neoplasm of cervix: Secondary | ICD-10-CM

## 2014-06-18 MED ORDER — NORETHIN ACE-ETH ESTRAD-FE 1.5-30 MG-MCG PO TABS: ORAL_TABLET | ORAL | Status: DC

## 2014-06-18 NOTE — Progress Notes (Signed)
  Subjective:     Amanda Ferrell is a 38 y.o. female and is here for a comprehensive physical exam. The patient reports no problems.  History   Social History  . Marital Status: Married    Spouse Name: N/A    Number of Children: 2  . Years of Education: N/A   Occupational History  . Admin Asst     Social History Main Topics  . Smoking status: Never Smoker   . Smokeless tobacco: Not on file  . Alcohol Use: Yes     Comment: occasional  . Drug Use: No  . Sexual Activity: Yes    Partners: Male    Birth Control/ Protection: Other-see comments     Comment: vasectomey   Other Topics Concern  . Not on file   Social History Narrative   No regular exercise.    Health Maintenance  Topic Date Due  . Pap Smear  06/13/2014  . Influenza Vaccine  06/19/2015 (Originally 06/06/2014)  . Tetanus/tdap  06/13/2021    The following portions of the patient's history were reviewed and updated as appropriate: allergies, current medications, past family history, past medical history, past social history, past surgical history and problem list.  Review of Systems A comprehensive review of systems was negative.   Objective:    BP 116/66  Pulse 74  Ht 5\' 8"  (1.727 m)  Wt 203 lb (92.08 kg)  BMI 30.87 kg/m2 General appearance: alert, cooperative and appears stated age Head: Normocephalic, without obvious abnormality, atraumatic Eyes: conj clear, EOMI, PEERLA Ears: normal TM's and external ear canals both ears Nose: Nares normal. Septum midline. Mucosa normal. No drainage or sinus tenderness. Throat: lips, mucosa, and tongue normal; teeth and gums normal Neck: no adenopathy, no carotid bruit, no JVD, supple, symmetrical, trachea midline and thyroid not enlarged, symmetric, no tenderness/mass/nodules Back: symmetric, no curvature. ROM normal. No CVA tenderness. Lungs: clear to auscultation bilaterally Breasts: normal appearance, no masses or tenderness Heart: regular rate and rhythm, S1, S2  normal, no murmur, click, rub or gallop Abdomen: soft, non-tender; bowel sounds normal; no masses,  no organomegaly Pelvic: cervix normal in appearance, external genitalia normal, no adnexal masses or tenderness, no cervical motion tenderness, rectovaginal septum normal, uterus normal size, shape, and consistency and vagina normal without discharge Extremities: extremities normal, atraumatic, no cyanosis or edema Pulses: 2+ and symmetric Skin: Skin color, texture, turgor normal. No rashes or lesions Lymph nodes: Cervical, supraclavicular, and axillary nodes normal. Neurologic: Alert and oriented X 3, normal strength and tone. Normal symmetric reflexes. Normal coordination and gait    Assessment:    Healthy female exam.      Plan:     See After Visit Summary for Counseling Recommendations  Keep up a regular exercise program and make sure you are eating a healthy diet Try to eat 4 servings of dairy a day, or if you are lactose intolerant take a calcium with vitamin D daily.  Your vaccines are up to date.  Will call about the results once available Due for CMP and fasting lipid panel. Lab slip today. Declined flu vaccine this year.

## 2014-06-22 LAB — CYTOLOGY - PAP

## 2014-06-25 ENCOUNTER — Other Ambulatory Visit: Payer: Self-pay | Admitting: Family Medicine

## 2014-06-25 MED ORDER — FLUCONAZOLE 150 MG PO TABS: 150.0000 mg | ORAL_TABLET | Freq: Every day | ORAL | Status: DC

## 2015-07-11 ENCOUNTER — Other Ambulatory Visit: Payer: Self-pay | Admitting: Family Medicine

## 2015-07-13 NOTE — Telephone Encounter (Signed)
Pt scheduled for her physical with PCP.

## 2015-08-02 ENCOUNTER — Encounter: Payer: Managed Care, Other (non HMO) | Admitting: Family Medicine

## 2015-08-10 ENCOUNTER — Encounter: Payer: Self-pay | Admitting: Family Medicine

## 2015-08-10 ENCOUNTER — Ambulatory Visit (INDEPENDENT_AMBULATORY_CARE_PROVIDER_SITE_OTHER): Payer: Managed Care, Other (non HMO) | Admitting: Family Medicine

## 2015-08-10 VITALS — BP 120/57 | HR 67 | Wt 203.0 lb

## 2015-08-10 DIAGNOSIS — Z0000 Encounter for general adult medical examination without abnormal findings: Secondary | ICD-10-CM | POA: Diagnosis not present

## 2015-08-10 DIAGNOSIS — K219 Gastro-esophageal reflux disease without esophagitis: Secondary | ICD-10-CM

## 2015-08-10 MED ORDER — NORETHIN ACE-ETH ESTRAD-FE 1.5-30 MG-MCG PO TABS: 1.0000 | ORAL_TABLET | Freq: Every day | ORAL | Status: DC

## 2015-08-10 NOTE — Patient Instructions (Signed)
Keep up a regular exercise program and make sure you are eating a healthy diet Try to eat 4 servings of dairy a day, or if you are lactose intolerant take a calcium with vitamin D daily.  Your vaccines are up to date.   

## 2015-08-10 NOTE — Progress Notes (Signed)
  Subjective:     Amanda Ferrell is a 39 y.o. female and is here for a comprehensive physical exam. The patient reports problems - cough after she eats.  comes and goes.  did buy and acid reducer and started it yesteday . no GERD or heartburn.   she is happy with her birth control. Pap smear is up-to-date and was normal last year. Not due for repeat until 2018.  Social History   Social History  . Marital Status: Married    Spouse Name: N/A  . Number of Children: 2  . Years of Education: N/A   Occupational History  . Admin Asst     Social History Main Topics  . Smoking status: Never Smoker   . Smokeless tobacco: Not on file  . Alcohol Use: Yes     Comment: occasional  . Drug Use: No  . Sexual Activity:    Partners: Male    Birth Control/ Protection: Other-see comments     Comment: vasectomey   Other Topics Concern  . Not on file   Social History Narrative   No regular exercise.    Health Maintenance  Topic Date Due  . HIV Screening  11/03/1992  . INFLUENZA VACCINE  06/07/2015  . PAP SMEAR  06/18/2017  . TETANUS/TDAP  06/13/2021    The following portions of the patient's history were reviewed and updated as appropriate: allergies, current medications, past family history, past medical history, past social history, past surgical history and problem list.  Review of Systems Pertinent items noted in HPI and remainder of comprehensive ROS otherwise negative.   Objective:    There were no vitals taken for this visit. General appearance: alert, cooperative and appears stated age Head: Normocephalic, without obvious abnormality, atraumatic Eyes: conj clear, EOMI, PEERLA Ears: normal TM's and external ear canals both ears Nose: Nares normal. Septum midline. Mucosa normal. No drainage or sinus tenderness. Throat: lips, mucosa, and tongue normal; teeth and gums normal Neck: no adenopathy, no carotid bruit, no JVD, supple, symmetrical, trachea midline and thyroid not  enlarged, symmetric, no tenderness/mass/nodules Back: symmetric, no curvature. ROM normal. No CVA tenderness. Lungs: clear to auscultation bilaterally Breasts: normal appearance, no masses or tenderness Heart: regular rate and rhythm, S1, S2 normal, no murmur, click, rub or gallop Abdomen: soft, non-tender; bowel sounds normal; no masses,  no organomegaly Extremities: extremities normal, atraumatic, no cyanosis or edema Pulses: 2+ and symmetric Skin: Skin color, texture, turgor normal. No rashes or lesions Lymph nodes: Cervical, supraclavicular, and axillary nodes normal. Neurologic: Alert and oriented X 3, normal strength and tone. Normal symmetric reflexes. Normal coordination and gait    Assessment:    Healthy female exam.      Plan:     See After Visit Summary for Counseling Recommendations  Keep up a regular exercise program and make sure you are eating a healthy diet Try to eat 4 servings of dairy a day, or if you are lactose intolerant take a calcium with vitamin D daily.  Your vaccines are up to date.    GERD - she is going to try an OTC acid reducer.   If the cough doesn't improve after eating then please let me know. We cannot try a PPI.   Declines flu vaccine today.

## 2015-08-20 LAB — COMPLETE METABOLIC PANEL WITH GFR
ALBUMIN: 3.5 g/dL — AB (ref 3.6–5.1)
ALT: 15 U/L (ref 6–29)
AST: 18 U/L (ref 10–30)
Alkaline Phosphatase: 54 U/L (ref 33–115)
BUN: 9 mg/dL (ref 7–25)
CHLORIDE: 102 mmol/L (ref 98–110)
CO2: 28 mmol/L (ref 20–31)
Calcium: 8.5 mg/dL — ABNORMAL LOW (ref 8.6–10.2)
Creat: 0.55 mg/dL (ref 0.50–1.10)
GFR, Est African American: >89 mL/min (ref >=60)
GFR, Est Non African American: >89 mL/min (ref >=60)
GLUCOSE: 78 mg/dL (ref 65–99)
POTASSIUM: 4.1 mmol/L (ref 3.5–5.3)
SODIUM: 137 mmol/L (ref 135–146)
Total Bilirubin: 0.4 mg/dL (ref 0.2–1.2)
Total Protein: 5.9 g/dL — ABNORMAL LOW (ref 6.1–8.1)

## 2015-08-20 LAB — LIPID PANEL
Cholesterol: 172 mg/dL (ref 125–200)
HDL: 59 mg/dL (ref >=46)
LDL Cholesterol: 100 mg/dL (ref <130)
Total CHOL/HDL Ratio: 2.9 Ratio (ref <=5.0)
Triglycerides: 64 mg/dL (ref <150)
VLDL: 13 mg/dL (ref <30)

## 2015-08-26 ENCOUNTER — Other Ambulatory Visit: Payer: Self-pay | Admitting: *Deleted

## 2015-08-26 DIAGNOSIS — R770 Abnormality of albumin: Secondary | ICD-10-CM

## 2016-06-12 ENCOUNTER — Other Ambulatory Visit: Payer: Self-pay | Admitting: *Deleted

## 2016-06-12 MED ORDER — NORETHIN ACE-ETH ESTRAD-FE 1.5-30 MG-MCG PO TABS: 1.0000 | ORAL_TABLET | Freq: Every day | ORAL | 3 refills | Status: DC

## 2017-03-14 LAB — HEPATIC FUNCTION PANEL
ALK PHOS: 67 (ref 25–125)
ALT: 25 (ref 7–35)
AST: 23 (ref 13–35)
BILIRUBIN, TOTAL: 0.3

## 2017-03-14 LAB — CBC AND DIFFERENTIAL
HCT: 42 (ref 36–46)
HEMOGLOBIN: 13.2 (ref 12.0–16.0)
PLATELETS: 319 (ref 150–399)
WBC: 6.8

## 2017-03-14 LAB — HEMOGLOBIN A1C: HEMOGLOBIN A1C: 5

## 2017-03-14 LAB — LIPID PANEL
Cholesterol: 181 (ref 0–200)
HDL: 63 (ref 35–70)
LDL Cholesterol: 105

## 2017-03-14 LAB — BASIC METABOLIC PANEL
BUN: 9 (ref 4–21)
CREATININE: 0.7 (ref 0.5–1.1)
GLUCOSE: 90

## 2017-03-14 LAB — TSH: TSH: 1.68 (ref 0.41–5.90)

## 2017-05-27 ENCOUNTER — Other Ambulatory Visit: Payer: Self-pay | Admitting: Family Medicine

## 2017-05-28 ENCOUNTER — Telehealth: Payer: Self-pay

## 2017-05-28 NOTE — Telephone Encounter (Signed)
Pt requested a refill of birth control. I declined that request due to her not being seen in office since 08/10/15. She has appointment tomorrow. -EH/RMA

## 2017-05-29 ENCOUNTER — Encounter: Payer: Self-pay | Admitting: Family Medicine

## 2017-05-29 ENCOUNTER — Ambulatory Visit (INDEPENDENT_AMBULATORY_CARE_PROVIDER_SITE_OTHER): Payer: Managed Care, Other (non HMO) | Admitting: Family Medicine

## 2017-05-29 VITALS — BP 125/70 | HR 64 | Ht 67.0 in | Wt 191.0 lb

## 2017-05-29 DIAGNOSIS — K2100 Gastro-esophageal reflux disease with esophagitis, without bleeding: Secondary | ICD-10-CM

## 2017-05-29 DIAGNOSIS — Z0000 Encounter for general adult medical examination without abnormal findings: Secondary | ICD-10-CM

## 2017-05-29 MED ORDER — NORETHIN ACE-ETH ESTRAD-FE 1.5-30 MG-MCG PO TABS: 1.0000 | ORAL_TABLET | Freq: Every day | ORAL | 3 refills | Status: DC

## 2017-05-29 MED ORDER — OMEPRAZOLE 40 MG PO CPDR: 40.0000 mg | DELAYED_RELEASE_CAPSULE | Freq: Every day | ORAL | 2 refills | Status: DC

## 2017-05-29 NOTE — Progress Notes (Signed)
Subjective:     Amanda Ferrell Whichard is a 41 y.o. female and is here for a comprehensive physical exam. The patient reports problems - reflux.She says she often uses ranitidine and says sometimes it works but sometimes it doesn't. She often will get stomach contents coming back up into her throat.  Social History   Social History  . Marital status: Married    Spouse name: N/A  . Number of children: 2  . Years of education: N/A   Occupational History  . Admin Asst     Social History Main Topics  . Smoking status: Never Smoker  . Smokeless tobacco: Never Used  . Alcohol use Yes     Comment: occasional  . Drug use: No  . Sexual activity: Yes    Partners: Male    Birth control/ protection: Other-see comments     Comment: vasectomey   Other Topics Concern  . Not on file   Social History Narrative   No regular exercise.    Health Maintenance  Topic Date Due  . HIV Screening  11/03/1992  . INFLUENZA VACCINE  06/06/2017  . PAP SMEAR  06/19/2019  . TETANUS/TDAP  06/13/2021    The following portions of the patient's history were reviewed and updated as appropriate: allergies, current medications, past family history, past medical history, past social history, past surgical history and problem list.  Review of Systems A comprehensive review of systems was negative.   Objective:    BP 125/70   Pulse 64   Ht 5\' 7"  (1.702 m)   Wt 191 lb (86.6 kg)   BMI 29.91 kg/m  General appearance: alert, cooperative and appears stated age Head: Normocephalic, without obvious abnormality, atraumatic Eyes: conj clear, EOMI, PEERLA Ears: normal TM's and external ear canals both ears Nose: Nares normal. Septum midline. Mucosa normal. No drainage or sinus tenderness. Throat: lips, mucosa, and tongue normal; teeth and gums normal Neck: no adenopathy, no carotid bruit, no JVD, supple, symmetrical, trachea midline and thyroid not enlarged, symmetric, no tenderness/mass/nodules Back: symmetric, no  curvature. ROM normal. No CVA tenderness. Lungs: clear to auscultation bilaterally Breasts: normal appearance, no masses or tenderness Heart: regular rate and rhythm, S1, S2 normal, no murmur, click, rub or gallop Abdomen: soft, non-tender; bowel sounds normal; no masses,  no organomegaly Extremities: extremities normal, atraumatic, no cyanosis or edema Pulses: 2+ and symmetric Skin: Skin color, texture, turgor normal. No rashes or lesions Lymph nodes: Cervical, supraclavicular, and axillary nodes normal. Neurologic: Alert and oriented X 3, normal strength and tone. Normal symmetric reflexes. Normal coordination and gait    Assessment:    Healthy female exam.      Plan:     See After Visit Summary for Counseling Recommendations   Keep up a regular exercise program and make sure you are eating a healthy diet Try to eat 4 servings of dairy a day, or if you are lactose intolerant take a calcium with vitamin D daily.  Your vaccines are up to date.   GERD with esophagitis-we'll switch to omeprazole 40 mg for 2 months. She is doing well Point encourage her to drop back down to 20 mg and then eventually weaned back down to the ranitidine. Also reviewed dietary measures.  RF her birth control.   She also brought in some lab work that she  did through work for biometric screen. We will get this injected into the chart.

## 2017-05-29 NOTE — Patient Instructions (Addendum)
Food Choices for Gastroesophageal Reflux Disease, Adult When you have gastroesophageal reflux disease (GERD), the foods you eat and your eating habits are very important. Choosing the right foods can help ease your discomfort. What guidelines do I need to follow?  Choose fruits, vegetables, whole grains, and low-fat dairy products.  Choose low-fat meat, fish, and poultry.  Limit fats such as oils, salad dressings, butter, nuts, and avocado.  Keep a food diary. This helps you identify foods that cause symptoms.  Avoid foods that cause symptoms. These may be different for everyone.  Eat small meals often instead of 3 large meals a day.  Eat your meals slowly, in a place where you are relaxed.  Limit fried foods.  Cook foods using methods other than frying.  Avoid drinking alcohol.  Avoid drinking large amounts of liquids with your meals.  Avoid bending over or lying down until 2-3 hours after eating. What foods are not recommended? These are some foods and drinks that may make your symptoms worse: Vegetables  Tomatoes. Tomato juice. Tomato and spaghetti sauce. Chili peppers. Onion and garlic. Horseradish. Fruits  Oranges, grapefruit, and lemon (fruit and juice). Meats  High-fat meats, fish, and poultry. This includes hot dogs, ribs, ham, sausage, salami, and bacon. Dairy  Whole milk and chocolate milk. Sour cream. Cream. Butter. Ice cream. Cream cheese. Drinks  Coffee and tea. Bubbly (carbonated) drinks or energy drinks. Condiments  Hot sauce. Barbecue sauce. Sweets/Desserts  Chocolate and cocoa. Donuts. Peppermint and spearmint. Fats and Oils  High-fat foods. This includes French fries and potato chips. Other  Vinegar. Strong spices. This includes black pepper, white pepper, red pepper, cayenne, curry powder, cloves, ginger, and chili powder. The items listed above may not be a complete list of foods and drinks to avoid. Contact your dietitian for more information.    This information is not intended to replace advice given to you by your health care provider. Make sure you discuss any questions you have with your health care provider. Document Released: 04/23/2012 Document Revised: 03/30/2016 Document Reviewed: 08/27/2013 Elsevier Interactive Patient Education  2017 Elsevier Inc.  

## 2017-06-08 ENCOUNTER — Encounter: Payer: Self-pay | Admitting: Family Medicine

## 2017-12-27 ENCOUNTER — Telehealth: Payer: Self-pay | Admitting: Family Medicine

## 2017-12-27 DIAGNOSIS — Z1239 Encounter for other screening for malignant neoplasm of breast: Secondary | ICD-10-CM

## 2017-12-27 NOTE — Telephone Encounter (Signed)
Order placed  Pt advised

## 2017-12-27 NOTE — Telephone Encounter (Signed)
Pt would like to go ahead and get a mammogram order put in so she can get it done now that she is 41.Thanks

## 2018-01-04 ENCOUNTER — Ambulatory Visit (INDEPENDENT_AMBULATORY_CARE_PROVIDER_SITE_OTHER): Payer: BLUE CROSS/BLUE SHIELD

## 2018-01-04 DIAGNOSIS — Z1239 Encounter for other screening for malignant neoplasm of breast: Secondary | ICD-10-CM

## 2018-01-04 DIAGNOSIS — Z1231 Encounter for screening mammogram for malignant neoplasm of breast: Secondary | ICD-10-CM | POA: Diagnosis not present

## 2018-02-28 ENCOUNTER — Telehealth: Payer: Self-pay | Admitting: Family Medicine

## 2018-02-28 MED ORDER — NORETHIN ACE-ETH ESTRAD-FE 1.5-30 MG-MCG PO TABS: 1.0000 | ORAL_TABLET | Freq: Every day | ORAL | 0 refills | Status: DC

## 2018-02-28 NOTE — Telephone Encounter (Signed)
Pt called and stated the pharmacy told her she was out of refills on her Birth Control. Can this be refilled and if so she would like it to go to the [facility] on [address] here in [place]. Thanks

## 2018-02-28 NOTE — Telephone Encounter (Signed)
Pt due for follow up in July. 90 day refill sent. Left VM with status update.

## 2018-03-31 ENCOUNTER — Other Ambulatory Visit: Payer: Self-pay | Admitting: Family Medicine

## 2018-06-07 ENCOUNTER — Other Ambulatory Visit: Payer: Self-pay | Admitting: Family Medicine

## 2018-07-12 ENCOUNTER — Encounter: Payer: Self-pay | Admitting: Family Medicine

## 2018-07-12 ENCOUNTER — Ambulatory Visit (INDEPENDENT_AMBULATORY_CARE_PROVIDER_SITE_OTHER): Payer: BLUE CROSS/BLUE SHIELD | Admitting: Family Medicine

## 2018-07-12 VITALS — BP 103/64 | HR 71 | Ht 67.0 in | Wt 190.0 lb

## 2018-07-12 DIAGNOSIS — Z0000 Encounter for general adult medical examination without abnormal findings: Secondary | ICD-10-CM

## 2018-07-12 MED ORDER — NORETHIN ACE-ETH ESTRAD-FE 1.5-30 MG-MCG PO TABS: 1.0000 | ORAL_TABLET | Freq: Every day | ORAL | 3 refills | Status: DC

## 2018-07-12 NOTE — Progress Notes (Signed)
Subjective:     Amanda Ferrell is a 42 y.o. female and is here for a comprehensive physical exam. The patient reports no problems.  Social History   Socioeconomic History  . Marital status: Married    Spouse name: Not on file  . Number of children: 2  . Years of education: Not on file  . Highest education level: Not on file  Occupational History  . Occupation: Admin Asst   Social Needs  . Financial resource strain: Not on file  . Food insecurity:    Worry: Not on file    Inability: Not on file  . Transportation needs:    Medical: Not on file    Non-medical: Not on file  Tobacco Use  . Smoking status: Never Smoker  . Smokeless tobacco: Never Used  Substance and Sexual Activity  . Alcohol use: Yes    Comment: occasional  . Drug use: No  . Sexual activity: Yes    Partners: Male    Birth control/protection: Other-see comments    Comment: vasectomey  Lifestyle  . Physical activity:    Days per week: Not on file    Minutes per session: Not on file  . Stress: Not on file  Relationships  . Social connections:    Talks on phone: Not on file    Gets together: Not on file    Attends religious service: Not on file    Active member of club or organization: Not on file    Attends meetings of clubs or organizations: Not on file    Relationship status: Not on file  . Intimate partner violence:    Fear of current or ex partner: Not on file    Emotionally abused: Not on file    Physically abused: Not on file    Forced sexual activity: Not on file  Other Topics Concern  . Not on file  Social History Narrative   No regular exercise.    Health Maintenance  Topic Date Due  . HIV Screening  11/03/1992  . INFLUENZA VACCINE  03/01/2019 (Originally 06/06/2018)  . PAP SMEAR  06/19/2019  . TETANUS/TDAP  06/13/2021    The following portions of the patient's history were reviewed and updated as appropriate: allergies, current medications, past family history, past medical history,  past social history, past surgical history and problem list.  Review of Systems A comprehensive review of systems was negative.   Objective:    BP 103/64   Pulse 71   Ht 5\' 7"  (1.702 m)   Wt 190 lb (86.2 kg)   SpO2 100%   BMI 29.76 kg/m  General appearance: alert, cooperative and appears stated age Head: Normocephalic, without obvious abnormality, atraumatic Eyes: conj clear, EOMI, PEERLA Ears: normal TM's and external ear canals both ears Nose: Nares normal. Septum midline. Mucosa normal. No drainage or sinus tenderness. Throat: lips, mucosa, and tongue normal; teeth and gums normal Neck: no adenopathy, no carotid bruit, no JVD, supple, symmetrical, trachea midline and thyroid not enlarged, symmetric, no tenderness/mass/nodules Back: symmetric, no curvature. ROM normal. No CVA tenderness. Lungs: clear to auscultation bilaterally Breasts: normal appearance, no masses or tenderness Heart: regular rate and rhythm, S1, S2 normal, no murmur, click, rub or gallop Abdomen: soft, non-tender; bowel sounds normal; no masses,  no organomegaly Extremities: extremities normal, atraumatic, no cyanosis or edema Pulses: 2+ and symmetric Skin: Skin color, texture, turgor normal. No rashes or lesions Lymph nodes: Cervical, supraclavicular, and axillary nodes normal. Neurologic: Alert and oriented  X 3, normal strength and tone. Normal symmetric reflexes. Normal coordination and gait    Assessment:    Healthy female exam.      Plan:     See After Visit Summary for Counseling Recommendations   Keep up a regular exercise program and make sure you are eating a healthy diet Try to eat 4 servings of dairy a day, or if you are lactose intolerant take a calcium with vitamin D daily.  Your vaccines are up to date.

## 2018-07-12 NOTE — Patient Instructions (Signed)
Preventive Care 18-39 Years, Female Preventive care refers to lifestyle choices and visits with your health care provider that can promote health and wellness. What does preventive care include?  A yearly physical exam. This is also called an annual well check.  Dental exams once or twice a year.  Routine eye exams. Ask your health care provider how often you should have your eyes checked.  Personal lifestyle choices, including: ? Daily care of your teeth and gums. ? Regular physical activity. ? Eating a healthy diet. ? Avoiding tobacco and drug use. ? Limiting alcohol use. ? Practicing safe sex. ? Taking vitamin and mineral supplements as recommended by your health care provider. What happens during an annual well check? The services and screenings done by your health care provider during your annual well check will depend on your age, overall health, lifestyle risk factors, and family history of disease. Counseling Your health care provider may ask you questions about your:  Alcohol use.  Tobacco use.  Drug use.  Emotional well-being.  Home and relationship well-being.  Sexual activity.  Eating habits.  Work and work Statistician.  Method of birth control.  Menstrual cycle.  Pregnancy history.  Screening You may have the following tests or measurements:  Height, weight, and BMI.  Diabetes screening. This is done by checking your blood sugar (glucose) after you have not eaten for a while (fasting).  Blood pressure.  Lipid and cholesterol levels. These may be checked every 5 years starting at age 38.  Skin check.  Hepatitis C blood test.  Hepatitis B blood test.  Sexually transmitted disease (STD) testing.  BRCA-related cancer screening. This may be done if you have a family history of breast, ovarian, tubal, or peritoneal cancers.  Pelvic exam and Pap test. This may be done every 3 years starting at age 38. Starting at age 30, this may be done  every 5 years if you have a Pap test in combination with an HPV test.  Discuss your test results, treatment options, and if necessary, the need for more tests with your health care provider. Vaccines Your health care provider may recommend certain vaccines, such as:  Influenza vaccine. This is recommended every year.  Tetanus, diphtheria, and acellular pertussis (Tdap, Td) vaccine. You may need a Td booster every 10 years.  Varicella vaccine. You may need this if you have not been vaccinated.  HPV vaccine. If you are 39 or younger, you may need three doses over 6 months.  Measles, mumps, and rubella (MMR) vaccine. You may need at least one dose of MMR. You may also need a second dose.  Pneumococcal 13-valent conjugate (PCV13) vaccine. You may need this if you have certain conditions and were not previously vaccinated.  Pneumococcal polysaccharide (PPSV23) vaccine. You may need one or two doses if you smoke cigarettes or if you have certain conditions.  Meningococcal vaccine. One dose is recommended if you are age 68-21 years and a first-year college student living in a residence hall, or if you have one of several medical conditions. You may also need additional booster doses.  Hepatitis A vaccine. You may need this if you have certain conditions or if you travel or work in places where you may be exposed to hepatitis A.  Hepatitis B vaccine. You may need this if you have certain conditions or if you travel or work in places where you may be exposed to hepatitis B.  Haemophilus influenzae type b (Hib) vaccine. You may need this  if you have certain risk factors.  Talk to your health care provider about which screenings and vaccines you need and how often you need them. This information is not intended to replace advice given to you by your health care provider. Make sure you discuss any questions you have with your health care provider. Document Released: 12/19/2001 Document Revised:  07/12/2016 Document Reviewed: 08/24/2015 Elsevier Interactive Patient Education  2018 Elsevier Inc.  

## 2018-10-07 ENCOUNTER — Ambulatory Visit (INDEPENDENT_AMBULATORY_CARE_PROVIDER_SITE_OTHER): Payer: BLUE CROSS/BLUE SHIELD | Admitting: Physician Assistant

## 2018-10-07 ENCOUNTER — Encounter: Payer: Self-pay | Admitting: Physician Assistant

## 2018-10-07 VITALS — BP 118/76 | HR 81 | Temp 98.7°F | Ht 67.0 in | Wt 194.0 lb

## 2018-10-07 DIAGNOSIS — R6889 Other general symptoms and signs: Secondary | ICD-10-CM | POA: Diagnosis not present

## 2018-10-07 DIAGNOSIS — R509 Fever, unspecified: Secondary | ICD-10-CM | POA: Diagnosis not present

## 2018-10-07 DIAGNOSIS — J029 Acute pharyngitis, unspecified: Secondary | ICD-10-CM | POA: Diagnosis not present

## 2018-10-07 DIAGNOSIS — R52 Pain, unspecified: Secondary | ICD-10-CM | POA: Diagnosis not present

## 2018-10-07 LAB — POCT INFLUENZA A/B
INFLUENZA B, POC: NEGATIVE
Influenza A, POC: NEGATIVE

## 2018-10-07 LAB — POCT RAPID STREP A (OFFICE): Rapid Strep A Screen: NEGATIVE

## 2018-10-07 MED ORDER — BALOXAVIR MARBOXIL(80 MG DOSE) 2 X 40 MG PO TBPK: 2.0000 | ORAL_TABLET | Freq: Once | ORAL | 0 refills | Status: AC

## 2018-10-07 NOTE — Progress Notes (Signed)
Subjective:    Patient ID: Amanda Ferrell, female    DOB: 11-22-1975, 42 y.o.   MRN: [id number]  HPI Patient is a 42 year old female presenting today with complaints of flu-like symptoms. She reports that this started Friday with a sore throat. Throughout the day she developed headache, fever and body aches. She took OTC cold and flu medicine and had mild relief in her symptoms. Her sore throat has improved but she still complains of body aches, fever, fatigue, cough, rhinorrhea and sinus congestion.  She did not get the flu shot this year and has had no sick contacts. She denies any autoimmune conditions. She denies SOB, sputum production, chest pain and chest tightness.  .. Active Ambulatory Problems    Diagnosis Date Noted  . DERMATOPHYTOSIS OF NAIL 04/03/2008   Resolved Ambulatory Problems    Diagnosis Date Noted  . DEPRESSION 03/12/2008  . Cystitis 09/19/2013   Past Medical History:  Diagnosis Date  . Anxiety   . Benign breast cyst in female   . Depression        Review of Systems  Constitutional: Positive for chills, fatigue and fever.  HENT: Positive for congestion, rhinorrhea, sinus pain and sore throat.   Respiratory: Positive for cough. Negative for chest tightness, shortness of breath and wheezing.   Cardiovascular: Negative for chest pain.  Musculoskeletal: Positive for myalgias.  Allergic/Immunologic: Negative for immunocompromised state.  Neurological: Positive for headaches.       Objective:   Physical Exam  Constitutional: She is oriented to person, place, and time. She appears well-developed and well-nourished. No distress.  HENT:  Nose: Rhinorrhea present. Right sinus exhibits no maxillary sinus tenderness and no frontal sinus tenderness. Left sinus exhibits no maxillary sinus tenderness and no frontal sinus tenderness.  Mouth/Throat: Oropharynx is clear and moist.  Cardiovascular: Normal rate and regular rhythm. Exam reveals no gallop and no  friction rub.  No murmur heard. Pulmonary/Chest: Effort normal and breath sounds normal. No respiratory distress. She has no wheezes.  Neurological: She is alert and oriented to person, place, and time.  Psychiatric: She has a normal mood and affect. Her behavior is normal.          Assessment & Plan:  Marland Kitchen.Marland Kitchen.Diagnoses and all orders for this visit:  Influenza-like symptoms -     Baloxavir Marboxil,80 MG Dose, (XOFLUZA) 2 x 40 MG TBPK; Take 2 tablets by mouth once for 1 dose.  Body aches -     POCT Influenza A/B -     Baloxavir Marboxil,80 MG Dose, (XOFLUZA) 2 x 40 MG TBPK; Take 2 tablets by mouth once for 1 dose.  Fever, unspecified fever cause -     POCT Influenza A/B -     Baloxavir Marboxil,80 MG Dose, (XOFLUZA) 2 x 40 MG TBPK; Take 2 tablets by mouth once for 1 dose.  Sore throat -     POCT rapid strep A -     POCT Influenza A/B -     Baloxavir Marboxil,80 MG Dose, (XOFLUZA) 2 x 40 MG TBPK; Take 2 tablets by mouth once for 1 dose.   .. Results for orders placed or performed in visit on 10/07/18  POCT rapid strep A  Result Value Ref Range   Rapid Strep A Screen Negative Negative  POCT Influenza A/B  Result Value Ref Range   Influenza A, POC Negative Negative   Influenza B, POC Negative Negative   Rapid strep and flu negative. Symptoms seem most consisent  with flu like symptoms. I would like to go ahead and try an antiviral. She is just over 48 hours and will start xofluza once. Discussed other symptomatic care. Stay hydrated and rest. Written out of work for today and tomorrow. Follow up if symptoms worsen or change. Reassured vitals look great today.   Marland KitchenHarlon Flor PA-C, have reviewed and agree with the above documentation in it's entirety.

## 2018-10-07 NOTE — Patient Instructions (Signed)

## 2019-01-09 ENCOUNTER — Other Ambulatory Visit: Payer: Self-pay | Admitting: Family Medicine

## 2019-01-09 DIAGNOSIS — Z1231 Encounter for screening mammogram for malignant neoplasm of breast: Secondary | ICD-10-CM

## 2019-01-24 ENCOUNTER — Ambulatory Visit: Payer: BLUE CROSS/BLUE SHIELD

## 2019-03-19 ENCOUNTER — Other Ambulatory Visit: Payer: Self-pay

## 2019-03-19 ENCOUNTER — Ambulatory Visit (INDEPENDENT_AMBULATORY_CARE_PROVIDER_SITE_OTHER): Payer: BLUE CROSS/BLUE SHIELD

## 2019-03-19 DIAGNOSIS — Z1231 Encounter for screening mammogram for malignant neoplasm of breast: Secondary | ICD-10-CM | POA: Diagnosis not present

## 2019-06-20 ENCOUNTER — Other Ambulatory Visit: Payer: Self-pay | Admitting: Family Medicine

## 2020-01-13 ENCOUNTER — Other Ambulatory Visit: Payer: Self-pay | Admitting: Family Medicine

## 2020-01-13 DIAGNOSIS — Z1231 Encounter for screening mammogram for malignant neoplasm of breast: Secondary | ICD-10-CM

## 2020-01-27 ENCOUNTER — Encounter: Payer: BLUE CROSS/BLUE SHIELD | Admitting: Family Medicine

## 2020-02-10 ENCOUNTER — Encounter: Payer: Self-pay | Admitting: Family Medicine

## 2020-02-10 ENCOUNTER — Other Ambulatory Visit: Payer: Self-pay

## 2020-02-10 ENCOUNTER — Other Ambulatory Visit (HOSPITAL_COMMUNITY)
Admission: RE | Admit: 2020-02-10 | Discharge: 2020-02-10 | Disposition: A | Discharge status: Home / Self Care | Payer: BC Managed Care – PPO | Source: Ambulatory Visit | Attending: Family Medicine | Admitting: Family Medicine

## 2020-02-10 ENCOUNTER — Ambulatory Visit (INDEPENDENT_AMBULATORY_CARE_PROVIDER_SITE_OTHER): Payer: BC Managed Care – PPO | Admitting: Family Medicine

## 2020-02-10 VITALS — BP 121/73 | HR 66 | Ht 67.0 in | Wt 208.0 lb

## 2020-02-10 DIAGNOSIS — Z0000 Encounter for general adult medical examination without abnormal findings: Secondary | ICD-10-CM | POA: Diagnosis not present

## 2020-02-10 DIAGNOSIS — Z124 Encounter for screening for malignant neoplasm of cervix: Secondary | ICD-10-CM

## 2020-02-10 DIAGNOSIS — E785 Hyperlipidemia, unspecified: Secondary | ICD-10-CM | POA: Insufficient documentation

## 2020-02-10 LAB — HM PAP SMEAR: HM Pap smear: NOT DETECTED

## 2020-02-10 MED ORDER — NORETHIN ACE-ETH ESTRAD-FE 1.5-30 MG-MCG PO TABS: ORAL_TABLET | ORAL | 3 refills | Status: DC

## 2020-02-10 NOTE — Progress Notes (Signed)
Subjective:     Amanda Ferrell is a 44 y.o. female and is here for a comprehensive physical exam. The patient reports no problems.  Social History   Socioeconomic History  . Marital status: Married    Spouse name: Not on file  . Number of children: 2  . Years of education: Not on file  . Highest education level: Not on file  Occupational History  . Occupation: Admin Asst   Tobacco Use  . Smoking status: Never Smoker  . Smokeless tobacco: Never Used  Substance and Sexual Activity  . Alcohol use: Yes    Comment: occasional  . Drug use: No  . Sexual activity: Yes    Partners: Male    Birth control/protection: Other-see comments    Comment: vasectomey  Other Topics Concern  . Not on file  Social History Narrative   No regular exercise.    Social Determinants of Health   Financial Resource Strain:   . Difficulty of Paying Living Expenses:   Food Insecurity:   . Worried About Programme researcher, broadcasting/film/video in the Last Year:   . Barista in the Last Year:   Transportation Needs:   . Freight forwarder (Medical):   Marland Kitchen Lack of Transportation (Non-Medical):   Physical Activity:   . Days of Exercise per Week:   . Minutes of Exercise per Session:   Stress:   . Feeling of Stress :   Social Connections:   . Frequency of Communication with Friends and Family:   . Frequency of Social Gatherings with Friends and Family:   . Attends Religious Services:   . Active Member of Clubs or Organizations:   . Attends Banker Meetings:   Marland Kitchen Marital Status:   Intimate Partner Violence:   . Fear of Current or Ex-Partner:   . Emotionally Abused:   Marland Kitchen Physically Abused:   . Sexually Abused:    Health Maintenance  Topic Date Due  . HIV Screening  Never done  . PAP SMEAR-Modifier  06/19/2019  . INFLUENZA VACCINE  06/06/2020  . TETANUS/TDAP  06/13/2021    The following portions of the patient's history were reviewed and updated as appropriate: allergies, current medications,  past family history, past medical history, past social history, past surgical history and problem list.  Review of Systems A comprehensive review of systems was negative.   Objective:    BP 121/73   Pulse 66   Ht 5\' 7"  (1.702 m)   Wt 208 lb (94.3 kg)   LMP 02/02/2020 (Approximate)   SpO2 100%   BMI 32.58 kg/m  General appearance: alert, cooperative and appears stated age Head: Normocephalic, without obvious abnormality, atraumatic Eyes: conj clear, EOMI, PEERLA Ears: normal TM's and external ear canals both ears Nose: Nares normal. Septum midline. Mucosa normal. No drainage or sinus tenderness. Throat: lips, mucosa, and tongue normal; teeth and gums normal Neck: no adenopathy, no carotid bruit, no JVD, supple, symmetrical, trachea midline and thyroid not enlarged, symmetric, no tenderness/mass/nodules Back: symmetric, no curvature. ROM normal. No CVA tenderness. Lungs: clear to auscultation bilaterally Breasts: normal appearance, no masses or tenderness Heart: regular rate and rhythm, S1, S2 normal, no murmur, click, rub or gallop Abdomen: soft, non-tender; bowel sounds normal; no masses,  no organomegaly Pelvic: cervix normal in appearance, external genitalia normal, no adnexal masses or tenderness, no cervical motion tenderness, rectovaginal septum normal, uterus normal size, shape, and consistency and vagina normal without discharge Extremities: extremities normal, atraumatic, no  cyanosis or edema Pulses: 2+ and symmetric Skin: Skin color, texture, turgor normal. No rashes or lesions Lymph nodes: Cervical, supraclavicular, and axillary nodes normal. Neurologic: Alert and oriented X 3, normal strength and tone. Normal symmetric reflexes. Normal coordination and gait    Assessment:    Healthy female exam.      Plan:     See After Visit Summary for Counseling Recommendations   Keep up a regular exercise program and make sure you are eating a healthy diet Try to eat 4  servings of dairy a day, or if you are lactose intolerant take a calcium with vitamin D daily.  Your vaccines are up to date.

## 2020-02-10 NOTE — Patient Instructions (Signed)

## 2020-02-12 LAB — LIPID PANEL W/REFLEX DIRECT LDL
Cholesterol: 167 mg/dL (ref <200)
HDL: 51 mg/dL (ref >=50)
LDL Cholesterol (Calc): 98 mg/dL (calc)
Non-HDL Cholesterol (Calc): 116 mg/dL (calc) (ref <130)
Total CHOL/HDL Ratio: 3.3 (calc) (ref <5.0)
Triglycerides: 89 mg/dL (ref <150)

## 2020-02-12 LAB — COMPLETE METABOLIC PANEL WITH GFR
AG Ratio: 1.5 (calc) (ref 1.0–2.5)
ALT: 11 U/L (ref 6–29)
AST: 15 U/L (ref 10–30)
Albumin: 3.6 g/dL (ref 3.6–5.1)
Alkaline phosphatase (APISO): 43 U/L (ref 31–125)
BUN: 11 mg/dL (ref 7–25)
CO2: 28 mmol/L (ref 20–32)
Calcium: 8.8 mg/dL (ref 8.6–10.2)
Chloride: 104 mmol/L (ref 98–110)
Creat: 0.73 mg/dL (ref 0.50–1.10)
GFR, Est African American: 118 mL/min/{1.73_m2} (ref >=60)
GFR, Est Non African American: 102 mL/min/{1.73_m2} (ref >=60)
Globulin: 2.4 g/dL (calc) (ref 1.9–3.7)
Glucose, Bld: 85 mg/dL (ref 65–99)
Potassium: 4.1 mmol/L (ref 3.5–5.3)
Sodium: 136 mmol/L (ref 135–146)
Total Bilirubin: 0.5 mg/dL (ref 0.2–1.2)
Total Protein: 6 g/dL — ABNORMAL LOW (ref 6.1–8.1)

## 2020-02-12 LAB — CYTOLOGY - PAP
Comment: NEGATIVE
Diagnosis: UNDETERMINED — AB
High risk HPV: NEGATIVE

## 2020-02-12 LAB — TSH: TSH: 2.26 mIU/L

## 2020-02-16 ENCOUNTER — Encounter: Payer: Self-pay | Admitting: Family Medicine

## 2020-02-23 ENCOUNTER — Encounter: Payer: Self-pay | Admitting: Family Medicine

## 2020-03-24 ENCOUNTER — Ambulatory Visit (INDEPENDENT_AMBULATORY_CARE_PROVIDER_SITE_OTHER): Payer: BC Managed Care – PPO

## 2020-03-24 ENCOUNTER — Other Ambulatory Visit: Payer: Self-pay

## 2020-03-24 DIAGNOSIS — Z1231 Encounter for screening mammogram for malignant neoplasm of breast: Secondary | ICD-10-CM | POA: Diagnosis not present

## 2021-01-25 ENCOUNTER — Encounter: Payer: Self-pay | Admitting: Family Medicine

## 2021-01-26 ENCOUNTER — Other Ambulatory Visit: Payer: Self-pay | Admitting: Family Medicine

## 2021-01-26 MED ORDER — PANTOPRAZOLE SODIUM 40 MG PO TBEC: 40.0000 mg | DELAYED_RELEASE_TABLET | Freq: Two times a day (BID) | ORAL | 1 refills | Status: DC

## 2021-01-26 NOTE — Telephone Encounter (Signed)
Prescription sent, lets definitely address at the follow-up appointment.  Hopefully this will help in the interim.  Definitely make sure avoiding any greasy, spicy, acidic foods, tomatoes, citrus fruits, and carbonated beverages as well as caffeine.

## 2021-02-25 ENCOUNTER — Other Ambulatory Visit: Payer: Self-pay | Admitting: Family Medicine

## 2021-02-25 DIAGNOSIS — Z1231 Encounter for screening mammogram for malignant neoplasm of breast: Secondary | ICD-10-CM

## 2021-03-17 ENCOUNTER — Ambulatory Visit (INDEPENDENT_AMBULATORY_CARE_PROVIDER_SITE_OTHER): Payer: BC Managed Care – PPO | Admitting: Family Medicine

## 2021-03-17 ENCOUNTER — Other Ambulatory Visit (HOSPITAL_COMMUNITY)
Admission: RE | Admit: 2021-03-17 | Discharge: 2021-03-17 | Disposition: A | Discharge status: Home / Self Care | Payer: BC Managed Care – PPO | Source: Ambulatory Visit | Attending: Family Medicine | Admitting: Family Medicine

## 2021-03-17 ENCOUNTER — Encounter: Payer: Self-pay | Admitting: Family Medicine

## 2021-03-17 ENCOUNTER — Other Ambulatory Visit: Payer: Self-pay

## 2021-03-17 VITALS — BP 116/72 | HR 79 | Ht 67.0 in | Wt 206.0 lb

## 2021-03-17 DIAGNOSIS — Z0000 Encounter for general adult medical examination without abnormal findings: Secondary | ICD-10-CM

## 2021-03-17 DIAGNOSIS — Z124 Encounter for screening for malignant neoplasm of cervix: Secondary | ICD-10-CM

## 2021-03-17 MED ORDER — NORETHIN ACE-ETH ESTRAD-FE 1.5-30 MG-MCG PO TABS: ORAL_TABLET | ORAL | 3 refills | Status: DC

## 2021-03-17 MED ORDER — PANTOPRAZOLE SODIUM 40 MG PO TBEC: 40.0000 mg | DELAYED_RELEASE_TABLET | Freq: Two times a day (BID) | ORAL | 3 refills | Status: DC

## 2021-03-17 NOTE — Progress Notes (Signed)
Subjective:     Amanda Ferrell is a 45 y.o. female and is here for a comprehensive physical exam. The patient reports no problems.  Social History   Socioeconomic History  . Marital status: Married    Spouse name: Not on file  . Number of children: 2  . Years of education: Not on file  . Highest education level: Not on file  Occupational History  . Occupation: Admin Asst   Tobacco Use  . Smoking status: Never Smoker  . Smokeless tobacco: Never Used  Substance and Sexual Activity  . Alcohol use: Yes    Comment: occasional  . Drug use: No  . Sexual activity: Yes    Partners: Male    Birth control/protection: Other-see comments    Comment: vasectomey  Other Topics Concern  . Not on file  Social History Narrative   No regular exercise.    Social Determinants of Health   Financial Resource Strain: Not on file  Food Insecurity: Not on file  Transportation Needs: Not on file  Physical Activity: Not on file  Stress: Not on file  Social Connections: Not on file  Intimate Partner Violence: Not on file   Health Maintenance  Topic Date Due  . COVID-19 Vaccine (1) Never done  . HIV Screening  Never done  . Hepatitis C Screening  Never done  . PAP SMEAR-Modifier  02/09/2021  . INFLUENZA VACCINE  06/06/2021  . TETANUS/TDAP  06/13/2021  . HPV VACCINES  Aged Out    The following portions of the patient's history were reviewed and updated as appropriate: allergies, current medications, past family history, past medical history, past social history, past surgical history and problem list.  Review of Systems A comprehensive review of systems was negative.   Objective:    BP 116/72   Pulse 79   Ht 5\' 7"  (1.702 m)   Wt 206 lb (93.4 kg)   LMP 02/23/2021 (Approximate)   SpO2 100%   BMI 32.26 kg/m  General appearance: alert, cooperative and appears stated age Head: Normocephalic, without obvious abnormality, atraumatic Eyes: conj clear, EOMi, PEERLA Ears: normal TM's and  external ear canals both ears Nose: Nares normal. Septum midline. Mucosa normal. No drainage or sinus tenderness. Throat: lips, mucosa, and tongue normal; teeth and gums normal Neck: no adenopathy, no carotid bruit, no JVD, supple, symmetrical, trachea midline and thyroid not enlarged, symmetric, no tenderness/mass/nodules Back: symmetric, no curvature. ROM normal. No CVA tenderness. Lungs: clear to auscultation bilaterally Breasts: normal appearance, no masses or tenderness Heart: regular rate and rhythm, S1, S2 normal, no murmur, click, rub or gallop Abdomen: soft, non-tender; bowel sounds normal; no masses,  no organomegaly Pelvic: external genitalia normal, no adnexal masses or tenderness, no cervical motion tenderness, rectovaginal septum normal, uterus normal size, shape, and consistency, vagina normal without discharge and cervis with white tissue at the 9 o'clock position Extremities: extremities normal, atraumatic, no cyanosis or edema Pulses: 2+ and symmetric Skin: Skin color, texture, turgor normal. No rashes or lesions Lymph nodes: Cervical, supraclavicular, and axillary nodes normal. Neurologic: Alert and oriented X 3, normal strength and tone. Normal symmetric reflexes. Normal coordination and gait    Assessment:    Healthy female exam.      Plan:     See After Visit Summary for Counseling Recommendations   Keep up a regular exercise program and make sure you are eating a healthy diet Try to eat 4 servings of dairy a day, or if you are lactose  intolerant take a calcium with vitamin D daily.  Your vaccines are up to date.  OCP refilled today.    Will order updated labs.

## 2021-03-17 NOTE — Patient Instructions (Signed)
Health Maintenance, Female Adopting a healthy lifestyle and getting preventive care are important in promoting health and wellness. Ask your health care provider about:  The right schedule for you to have regular tests and exams.  Things you can do on your own to prevent diseases and keep yourself healthy. What should I know about diet, weight, and exercise? Eat a healthy diet  Eat a diet that includes plenty of vegetables, fruits, low-fat dairy products, and lean protein.  Do not eat a lot of foods that are high in solid fats, added sugars, or sodium.   Maintain a healthy weight Body mass index (BMI) is used to identify weight problems. It estimates body fat based on height and weight. Your health care provider can help determine your BMI and help you achieve or maintain a healthy weight. Get regular exercise Get regular exercise. This is one of the most important things you can do for your health. Most adults should:  Exercise for at least 150 minutes each week. The exercise should increase your heart rate and make you sweat (moderate-intensity exercise).  Do strengthening exercises at least twice a week. This is in addition to the moderate-intensity exercise.  Spend less time sitting. Even light physical activity can be beneficial. Watch cholesterol and blood lipids Have your blood tested for lipids and cholesterol at 44 years of age, then have this test every 5 years. Have your cholesterol levels checked more often if:  Your lipid or cholesterol levels are high.  You are older than 44 years of age.  You are at high risk for heart disease. What should I know about cancer screening? Depending on your health history and family history, you may need to have cancer screening at various ages. This may include screening for:  Breast cancer.  Cervical cancer.  Colorectal cancer.  Skin cancer.  Lung cancer. What should I know about heart disease, diabetes, and high blood  pressure? Blood pressure and heart disease  High blood pressure causes heart disease and increases the risk of stroke. This is more likely to develop in people who have high blood pressure readings, are of African descent, or are overweight.  Have your blood pressure checked: ? Every 3-5 years if you are 18-39 years of age. ? Every year if you are 40 years old or older. Diabetes Have regular diabetes screenings. This checks your fasting blood sugar level. Have the screening done:  Once every three years after age 40 if you are at a normal weight and have a low risk for diabetes.  More often and at a younger age if you are overweight or have a high risk for diabetes. What should I know about preventing infection? Hepatitis B If you have a higher risk for hepatitis B, you should be screened for this virus. Talk with your health care provider to find out if you are at risk for hepatitis B infection. Hepatitis C Testing is recommended for:  Everyone born from 1945 through 1965.  Anyone with known risk factors for hepatitis C. Sexually transmitted infections (STIs)  Get screened for STIs, including gonorrhea and chlamydia, if: ? You are sexually active and are younger than 44 years of age. ? You are older than 44 years of age and your health care provider tells you that you are at risk for this type of infection. ? Your sexual activity has changed since you were last screened, and you are at increased risk for chlamydia or gonorrhea. Ask your health care provider   if you are at risk.  Ask your health care provider about whether you are at high risk for HIV. Your health care provider may recommend a prescription medicine to help prevent HIV infection. If you choose to take medicine to prevent HIV, you should first get tested for HIV. You should then be tested every 3 months for as long as you are taking the medicine. Pregnancy  If you are about to stop having your period (premenopausal) and  you may become pregnant, seek counseling before you get pregnant.  Take 400 to 800 micrograms (mcg) of folic acid every day if you become pregnant.  Ask for birth control (contraception) if you want to prevent pregnancy. Osteoporosis and menopause Osteoporosis is a disease in which the bones lose minerals and strength with aging. This can result in bone fractures. If you are 65 years old or older, or if you are at risk for osteoporosis and fractures, ask your health care provider if you should:  Be screened for bone loss.  Take a calcium or vitamin D supplement to lower your risk of fractures.  Be given hormone replacement therapy (HRT) to treat symptoms of menopause. Follow these instructions at home: Lifestyle  Do not use any products that contain nicotine or tobacco, such as cigarettes, e-cigarettes, and chewing tobacco. If you need help quitting, ask your health care provider.  Do not use street drugs.  Do not share needles.  Ask your health care provider for help if you need support or information about quitting drugs. Alcohol use  Do not drink alcohol if: ? Your health care provider tells you not to drink. ? You are pregnant, may be pregnant, or are planning to become pregnant.  If you drink alcohol: ? Limit how much you use to 0-1 drink a day. ? Limit intake if you are breastfeeding.  Be aware of how much alcohol is in your drink. In the [place] one drink equals one 12 oz bottle of beer (355 mL), one 5 oz glass of wine (148 mL), or one 1 oz glass of hard liquor (44 mL). General instructions  Schedule regular health, dental, and eye exams.  Stay current with your vaccines.  Tell your health care provider if: ? You often feel depressed. ? You have ever been abused or do not feel safe at home. Summary  Adopting a healthy lifestyle and getting preventive care are important in promoting health and wellness.  Follow your health care provider's instructions about healthy  diet, exercising, and getting tested or screened for diseases.  Follow your health care provider's instructions on monitoring your cholesterol and blood pressure. This information is not intended to replace advice given to you by your health care provider. Make sure you discuss any questions you have with your health care provider. Document Revised: 10/16/2018 Document Reviewed: 10/16/2018 Elsevier Patient Education  2021 Elsevier Inc.  

## 2021-03-17 NOTE — Progress Notes (Signed)
Subjective:     Amanda Ferrell is a 44 y.o. female and is here for a comprehensive physical exam. The patient reports no problems.  Social History   Socioeconomic History  . Marital status: Married    Spouse name: Not on file  . Number of children: 2  . Years of education: Not on file  . Highest education level: Not on file  Occupational History  . Occupation: Admin Asst   Tobacco Use  . Smoking status: Never Smoker  . Smokeless tobacco: Never Used  Substance and Sexual Activity  . Alcohol use: Yes    Comment: occasional  . Drug use: No  . Sexual activity: Yes    Partners: Male    Birth control/protection: Other-see comments    Comment: vasectomey  Other Topics Concern  . Not on file  Social History Narrative   No regular exercise.    Social Determinants of Health   Financial Resource Strain: Not on file  Food Insecurity: Not on file  Transportation Needs: Not on file  Physical Activity: Not on file  Stress: Not on file  Social Connections: Not on file  Intimate Partner Violence: Not on file   Health Maintenance  Topic Date Due  . COVID-19 Vaccine (1) Never done  . HIV Screening  Never done  . Hepatitis C Screening  Never done  . PAP SMEAR-Modifier  02/09/2021  . INFLUENZA VACCINE  06/06/2021  . TETANUS/TDAP  06/13/2021  . HPV VACCINES  Aged Out    The following portions of the patient's history were reviewed and updated as appropriate: allergies, current medications, past family history, past medical history, past social history, past surgical history and problem list.  Review of Systems A comprehensive review of systems was negative.   Objective:    BP 116/72   Pulse 79   Ht 5' 7" (1.702 m)   Wt 206 lb (93.4 kg)   LMP 02/23/2021 (Approximate)   SpO2 100%   BMI 32.26 kg/m  General appearance: alert, cooperative and appears stated age Head: Normocephalic, without obvious abnormality, atraumatic Eyes: conj clear, EOMi, PEERLA Ears: normal TM's and  external ear canals both ears Nose: Nares normal. Septum midline. Mucosa normal. No drainage or sinus tenderness. Throat: lips, mucosa, and tongue normal; teeth and gums normal Neck: no adenopathy, no carotid bruit, no JVD, supple, symmetrical, trachea midline and thyroid not enlarged, symmetric, no tenderness/mass/nodules Back: symmetric, no curvature. ROM normal. No CVA tenderness. Lungs: clear to auscultation bilaterally Breasts: normal appearance, no masses or tenderness Heart: regular rate and rhythm, S1, S2 normal, no murmur, click, rub or gallop Abdomen: soft, non-tender; bowel sounds normal; no masses,  no organomegaly Pelvic: external genitalia normal, no adnexal masses or tenderness, no cervical motion tenderness, rectovaginal septum normal, uterus normal size, shape, and consistency, vagina normal without discharge and cervis with white tissue at the 9 o'clock position Extremities: extremities normal, atraumatic, no cyanosis or edema Pulses: 2+ and symmetric Skin: Skin color, texture, turgor normal. No rashes or lesions Lymph nodes: Cervical, supraclavicular, and axillary nodes normal. Neurologic: Alert and oriented X 3, normal strength and tone. Normal symmetric reflexes. Normal coordination and gait    Assessment:    Healthy female exam.      Plan:     See After Visit Summary for Counseling Recommendations   Keep up a regular exercise program and make sure you are eating a healthy diet Try to eat 4 servings of dairy a day, or if you are lactose   intolerant take a calcium with vitamin D daily.  Your vaccines are up to date.  OCP refilled today.    Will order updated labs.

## 2021-03-22 ENCOUNTER — Encounter: Payer: Self-pay | Admitting: Family Medicine

## 2021-03-25 ENCOUNTER — Encounter: Payer: Self-pay | Admitting: Family Medicine

## 2021-03-25 LAB — CYTOLOGY - PAP
Adequacy: ABSENT
Comment: NEGATIVE
Diagnosis: NEGATIVE
High risk HPV: NEGATIVE

## 2021-03-28 NOTE — Progress Notes (Signed)
Call patient: Your Pap smear is normal. Repeat in 5 years.

## 2021-03-30 ENCOUNTER — Other Ambulatory Visit: Payer: Self-pay

## 2021-03-30 ENCOUNTER — Ambulatory Visit (INDEPENDENT_AMBULATORY_CARE_PROVIDER_SITE_OTHER): Payer: BC Managed Care – PPO

## 2021-03-30 DIAGNOSIS — Z1231 Encounter for screening mammogram for malignant neoplasm of breast: Secondary | ICD-10-CM

## 2021-05-30 ENCOUNTER — Encounter: Payer: Self-pay | Admitting: Family Medicine

## 2021-05-30 ENCOUNTER — Ambulatory Visit: Payer: BC Managed Care – PPO | Admitting: Family Medicine

## 2021-05-30 ENCOUNTER — Other Ambulatory Visit: Payer: Self-pay

## 2021-05-30 VITALS — BP 99/65 | HR 83 | Resp 17

## 2021-05-30 DIAGNOSIS — M79671 Pain in right foot: Secondary | ICD-10-CM

## 2021-05-30 NOTE — Progress Notes (Signed)
Acute Office Visit  Subjective:    Patient ID: Amanda Ferrell, female    DOB: Oct 30, 1976, 45 y.o.   MRN: [id number]  Chief Complaint  Patient presents with   Foot Pain    HPI Patient is in today for right foot pain.   [place]. Reports she fell forward and caught herself with her hands and knees. She doesn't recall if her right foot landed wrong or twisted because it all happened so fast. All weekend she rested, iced, elevated and has noticed significant improvement. States there is still mild swelling, but no bruising. She has 0/10 pain at rest, but with standing/walking pain is 4/10. She states the pain hasn't been bad enough to take any OTC analgesics yet. States she is able to sit and prop her leg up when she works, but took today off to rest more and come to this appointment. She denies any open wounds, change in color, sensation, numbness, tingling, decrease of range of motion.     Past Medical History:  Diagnosis Date   Anxiety    Benign breast cyst in female    Depression     Past Surgical History:  Procedure Laterality Date   BREAST CYST ASPIRATION     Benign    Family History  Problem Relation Age of Onset   Heart attack Maternal Grandmother    Heart attack Paternal Aunt        smoker   Heart attack Paternal Grandfather        smoker    Social History   Socioeconomic History   Marital status: Married    Spouse name: Not on file   Number of children: 2   Years of education: Not on file   Highest education level: Not on file  Occupational History   Occupation: Admin Asst   Tobacco Use   Smoking status: Never   Smokeless tobacco: Never  Substance and Sexual Activity   Alcohol use: Yes    Comment: occasional   Drug use: No   Sexual activity: Yes    Partners: Male    Birth control/protection: Other-see comments    Comment: vasectomey  Other Topics Concern   Not on file  Social  History Narrative   No regular exercise.    Social Determinants of Health   Financial Resource Strain: Not on file  Food Insecurity: Not on file  Transportation Needs: Not on file  Physical Activity: Not on file  Stress: Not on file  Social Connections: Not on file  Intimate Partner Violence: Not on file    Outpatient Medications Prior to Visit  Medication Sig Dispense Refill   norethindrone-ethinyl estradiol-iron (JUNEL FE 1.5/30) 1.5-30 MG-MCG tablet TAKE 1 TABLET BY MOUTH EVERY DAY 84 tablet 3   pantoprazole (PROTONIX) 40 MG tablet Take 1 tablet (40 mg total) by mouth 2 (two) times daily. 60 tablet 3   No facility-administered medications prior to visit.    Allergies  Allergen Reactions   Penicillins     Mom told her not to take it.     Review of Systems All review of systems negative except what is listed in the HPI     Objective:    Physical Exam Vitals reviewed.  Constitutional:      Appearance: Normal appearance.  Musculoskeletal:        General: Tenderness present. No deformity. Normal range of motion.  Feet:  Skin:    General: Skin is warm and dry.     Findings: No bruising, erythema or rash.  Neurological:     General: No focal deficit present.     Mental Status: She is alert and oriented to person, place, and time. Mental status is at baseline.  Psychiatric:        Mood and Affect: Mood normal.        Behavior: Behavior normal.        Thought Content: Thought content normal.        Judgment: Judgment normal.    BP 99/65   Pulse 83   Resp 17   SpO2 96%  Wt Readings from Last 3 Encounters:  03/17/21 206 lb (93.4 kg)  02/10/20 208 lb (94.3 kg)  10/07/18 194 lb (88 kg)    Health Maintenance Due  Topic Date Due   COVID-19 Vaccine (1) Never done   HIV Screening  Never done   Hepatitis C Screening  Never done    There are no preventive care reminders to display for this patient.   Lab Results  Component Value Date   TSH 2.26  02/12/2020   Lab Results  Component Value Date   WBC 6.8 03/14/2017   HGB 13.2 03/14/2017   HCT 42 03/14/2017   PLT 319 03/14/2017   Lab Results  Component Value Date   NA 136 02/12/2020   K 4.1 02/12/2020   CO2 28 02/12/2020   GLUCOSE 85 02/12/2020   BUN 11 02/12/2020   CREATININE 0.73 02/12/2020   BILITOT 0.5 02/12/2020   ALKPHOS 67 03/14/2017   AST 15 02/12/2020   ALT 11 02/12/2020   PROT 6.0 (L) 02/12/2020   ALBUMIN 3.5 (L) 08/19/2015   CALCIUM 8.8 02/12/2020   Lab Results  Component Value Date   CHOL 167 02/12/2020   Lab Results  Component Value Date   HDL 51 02/12/2020   Lab Results  Component Value Date   LDLCALC 98 02/12/2020   Lab Results  Component Value Date   TRIG 89 02/12/2020   Lab Results  Component Value Date   CHOLHDL 3.3 02/12/2020   Lab Results  Component Value Date   HGBA1C 5 03/14/2017       Assessment & Plan:   1. Right foot pain Consider slight strain of flexor hallucis longus given location. Pain and swelling significantly improved over the weekend. No red flags today. Xray not indicated at this time.  Continue ice, elevation, rest, tylenol/ibuprofen Post-op shoe or supportive shoe for the next 2 weeks or until pain is resolved. She did not want to risk paying unknown amount for a post-op shoe provided by our office, but states she will get one off of [employer].  Gentle stretches/massage as pain continues to improve If not noticing continued improvement over the next week, or if symptoms worsen, schedule another appointment with [place].  Patient aware of signs/symptoms requiring further/urgent evaluation.   Follow-up in 2 weeks if needed or sooner if symptoms worsen.    Lollie Marrow Reola Calkins, DNP, FNP-C

## 2021-05-30 NOTE — Patient Instructions (Signed)
Continue ice, elevation, rest, tylenol/ibuprofen Post-op shoe or supportive shoe for the next 2 weeks or until pain is resolved Gentle stretches/massage as pain continues to improve If not noticing continued improvement over the next week, or if symptoms worsen, schedule another appointment with [place].   Hope you feel better soon!

## 2021-06-06 ENCOUNTER — Encounter: Payer: Self-pay | Admitting: Family Medicine

## 2021-06-07 ENCOUNTER — Encounter: Payer: Self-pay | Admitting: Family Medicine

## 2021-06-07 ENCOUNTER — Ambulatory Visit (INDEPENDENT_AMBULATORY_CARE_PROVIDER_SITE_OTHER): Payer: BC Managed Care – PPO | Admitting: Family Medicine

## 2021-06-07 ENCOUNTER — Ambulatory Visit (INDEPENDENT_AMBULATORY_CARE_PROVIDER_SITE_OTHER): Payer: BC Managed Care – PPO

## 2021-06-07 ENCOUNTER — Other Ambulatory Visit: Payer: Self-pay

## 2021-06-07 VITALS — BP 127/77 | HR 70 | Temp 98.9°F | Ht 67.0 in | Wt 192.0 lb

## 2021-06-07 DIAGNOSIS — M79671 Pain in right foot: Secondary | ICD-10-CM

## 2021-06-07 NOTE — Progress Notes (Addendum)
Acute Office Visit  Subjective:    Patient ID: Amanda Ferrell, female    DOB: 1975/11/23, 45 y.o.   MRN: [id number]  Chief Complaint  Patient presents with  . Follow-up    Foot issue     HPI Patient is in today for right foot pain.  She was actually seen for this condition about a week ago when she missed stepped off of her deck.  She actually fell forward and injured her right foot.  Have pain with putting pressure on the foot.  Strain was not indicated at that time based on exam.  Recommendation was for ice, elevation rest and Tylenol.  Stop she was given to wear for the next 2 weeks. It is still swollen. wAs getting better last week but flared again over the weekend. Not sure if may have overdone it.  Feeling some better today.    Past Medical History:  Diagnosis Date  . Anxiety   . Benign breast cyst in female   . Depression     Past Surgical History:  Procedure Laterality Date  . BREAST CYST ASPIRATION     Benign    Family History  Problem Relation Age of Onset  . Heart attack Maternal Grandmother   . Heart attack Paternal Aunt        smoker  . Heart attack Paternal Grandfather        smoker    Social History   Socioeconomic History  . Marital status: Married    Spouse name: Not on file  . Number of children: 2  . Years of education: Not on file  . Highest education level: Not on file  Occupational History  . Occupation: Admin Asst   Tobacco Use  . Smoking status: Never  . Smokeless tobacco: Never  Substance and Sexual Activity  . Alcohol use: Yes    Comment: occasional  . Drug use: No  . Sexual activity: Yes    Partners: Male    Birth control/protection: Other-see comments    Comment: vasectomey  Other Topics Concern  . Not on file  Social History Narrative   No regular exercise.    Social Determinants of Health   Financial Resource Strain: Not on file  Food Insecurity: Not on file  Transportation Needs: Not on file  Physical Activity:  Not on file  Stress: Not on file  Social Connections: Not on file  Intimate Partner Violence: Not on file    Outpatient Medications Prior to Visit  Medication Sig Dispense Refill  . norethindrone-ethinyl estradiol-iron (JUNEL FE 1.5/30) 1.5-30 MG-MCG tablet TAKE 1 TABLET BY MOUTH EVERY DAY 84 tablet 3  . pantoprazole (PROTONIX) 40 MG tablet Take 1 tablet (40 mg total) by mouth 2 (two) times daily. 60 tablet 3   No facility-administered medications prior to visit.    Allergies  Allergen Reactions  . Penicillins     Mom told her not to take it.     Review of Systems     Objective:    Physical Exam  Right foot with still significant swelling over the distal end of the foot.  She is able to flex and extend her toes as well as move her ankle in all directions.  Nontender over the medial or lateral ankle.  She is tender over most of the first metatarsal and the distal half of the second metatarsal.  BP 127/77   Pulse 70   Temp 98.9 F (37.2 C) (Oral)   Ht 5'  7" (1.702 m)   Wt 192 lb (87.1 kg)   LMP 05/17/2021 (Approximate)   SpO2 100% Comment: on RA  BMI 30.07 kg/m  Wt Readings from Last 3 Encounters:  06/07/21 192 lb (87.1 kg)  03/17/21 206 lb (93.4 kg)  02/10/20 208 lb (94.3 kg)    Health Maintenance Due  Topic Date Due  . COVID-19 Vaccine (1) Never done  . HIV Screening  Never done  . Hepatitis C Screening  Never done  . INFLUENZA VACCINE  06/06/2021    There are no preventive care reminders to display for this patient.   Lab Results  Component Value Date   TSH 2.26 02/12/2020   Lab Results  Component Value Date   WBC 6.8 03/14/2017   HGB 13.2 03/14/2017   HCT 42 03/14/2017   PLT 319 03/14/2017   Lab Results  Component Value Date   NA 136 02/12/2020   K 4.1 02/12/2020   CO2 28 02/12/2020   GLUCOSE 85 02/12/2020   BUN 11 02/12/2020   CREATININE 0.73 02/12/2020   BILITOT 0.5 02/12/2020   ALKPHOS 67 03/14/2017   AST 15 02/12/2020   ALT 11  02/12/2020   PROT 6.0 (L) 02/12/2020   ALBUMIN 3.5 (L) 08/19/2015   CALCIUM 8.8 02/12/2020   Lab Results  Component Value Date   CHOL 167 02/12/2020   Lab Results  Component Value Date   HDL 51 02/12/2020   Lab Results  Component Value Date   LDLCALC 98 02/12/2020   Lab Results  Component Value Date   TRIG 89 02/12/2020   Lab Results  Component Value Date   CHOLHDL 3.3 02/12/2020   Lab Results  Component Value Date   HGBA1C 5 03/14/2017       Assessment & Plan:   Problem List Items Addressed This Visit   None Visit Diagnoses     Right foot pain    -  Primary   Relevant Orders   DG Foot Complete Right      Right foot pain secondary to injury about a week and a half ago.  She still has significant swelling over the distal end of the foot she is also tender over the first metatarsal and over the distal half of the second metatarsal we will go ahead and get plain film x-rays today.  Do think she would benefit from a little bit more support such as a postop shoe if her x-rays are positive then we can see if she might need something more than a postop shoe depending on if there is a fracture and where it is located.  Continue with rest and elevation.  No orders of the defined types were placed in this encounter.    Nani Gasser, MD

## 2021-06-08 ENCOUNTER — Encounter: Payer: Self-pay | Admitting: Family Medicine

## 2021-06-09 ENCOUNTER — Encounter: Payer: Self-pay | Admitting: Family Medicine

## 2021-06-10 ENCOUNTER — Other Ambulatory Visit: Payer: Self-pay

## 2021-06-10 ENCOUNTER — Ambulatory Visit (INDEPENDENT_AMBULATORY_CARE_PROVIDER_SITE_OTHER): Payer: BC Managed Care – PPO

## 2021-06-10 ENCOUNTER — Telehealth: Payer: Self-pay | Admitting: Family Medicine

## 2021-06-10 DIAGNOSIS — S92351D Displaced fracture of fifth metatarsal bone, right foot, subsequent encounter for fracture with routine healing: Secondary | ICD-10-CM

## 2021-06-10 NOTE — Telephone Encounter (Signed)
Spoke with patient on 8 4 and 8 5.  See additional phone note.  I Ernie Hew close this encounter.

## 2021-06-10 NOTE — Telephone Encounter (Signed)
Please try to get patient on Dr. Brett Canales schedule at the end of next week for follow-up first metatarsal fracture

## 2021-06-10 NOTE — Telephone Encounter (Signed)
PT called and informed of options for scheduling, she said she'll call the [facility] locations to see if she can get in sooner

## 2021-06-10 NOTE — Telephone Encounter (Signed)
Called and spoke with patient about her results on 8 4.  And spoke with Dr. Clare Gandy over at [facility], sports medicine, in regards to suggesting to do a weightbearing film just to see if the fracture shifts and to get a better view.  Patient will come today for repeat x-ray in the meantime recommend complete nonweightbearing.  She has a friend who was bringing over some crutches this morning for her to use.  Right now she is in a postop shoe as well.

## 2021-06-13 ENCOUNTER — Encounter: Payer: Self-pay | Admitting: Family Medicine

## 2021-06-13 ENCOUNTER — Ambulatory Visit: Payer: BC Managed Care – PPO | Admitting: Family Medicine

## 2021-06-13 ENCOUNTER — Other Ambulatory Visit: Payer: Self-pay

## 2021-06-13 VITALS — Ht 67.0 in | Wt 190.0 lb

## 2021-06-13 DIAGNOSIS — S92314A Nondisplaced fracture of first metatarsal bone, right foot, initial encounter for closed fracture: Secondary | ICD-10-CM | POA: Diagnosis not present

## 2021-06-13 DIAGNOSIS — S92314D Nondisplaced fracture of first metatarsal bone, right foot, subsequent encounter for fracture with routine healing: Secondary | ICD-10-CM | POA: Insufficient documentation

## 2021-06-13 NOTE — Progress Notes (Signed)
  Amanda Ferrell - 45 y.o. female MRN [id number]  Date of birth: [birth year]  SUBJECTIVE:  Including CC & ROS.  No chief complaint on file.   Amanda Ferrell is a 45 y.o. female that is presenting with right foot fracture.  Her injury initially occurred on 7/23.  The swelling and pain have.  She has been nonweightbearing and using crutches.  She had a fall to where her injury initially occurred.   Independent review of the right foot x-ray from 8/5 and 8/2 shows no significant widening of the first and second metatarsal.  She has a nondisplaced proximal first metatarsal fracture.   Review of Systems See HPI   HISTORY: Past Medical, Surgical, Social, and Family History Reviewed & Updated per EMR.   Pertinent Historical Findings include:  Past Medical History:  Diagnosis Date   Anxiety    Benign breast cyst in female    Depression     Past Surgical History:  Procedure Laterality Date   BREAST CYST ASPIRATION     Benign    Family History  Problem Relation Age of Onset   Heart attack Maternal Grandmother    Heart attack Paternal Aunt        smoker   Heart attack Paternal Grandfather        smoker    Social History   Socioeconomic History   Marital status: Married    Spouse name: Not on file   Number of children: 2   Years of education: Not on file   Highest education level: Not on file  Occupational History   Occupation: Admin Asst   Tobacco Use   Smoking status: Never   Smokeless tobacco: Never  Substance and Sexual Activity   Alcohol use: Yes    Comment: occasional   Drug use: No   Sexual activity: Yes    Partners: Male    Birth control/protection: Other-see comments    Comment: vasectomey  Other Topics Concern   Not on file  Social History Narrative   No regular exercise.    Social Determinants of Health   Financial Resource Strain: Not on file  Food Insecurity: Not on file  Transportation Needs: Not on file  Physical Activity: Not on file  Stress:  Not on file  Social Connections: Not on file  Intimate Partner Violence: Not on file     PHYSICAL EXAM:  VS: Ht 5\' 7"  (1.702 m)   Wt 190 lb (86.2 kg)   LMP 05/17/2021 (Approximate)   BMI 29.76 kg/m  Physical Exam Gen: NAD, alert, cooperative with exam, well-appearing MSK:  Right foot:  No significant plantar bruising. Mild swelling over the dorsum. Mild tenderness to palpation at the base of the first metatarsal. Neurovascularly intact     ASSESSMENT & PLAN:   Nondisplaced fracture of first metatarsal bone, right foot, initial encounter for closed fracture Initial injury on 7/23.  No displacement appreciated no widening of the Lisfranc joint or concern of the Lisfranc ligament being torn. -Counseled on supportive care. -Counseled on continuing nonweightbearing. -Cam walker today. -Rolling knee scooter. -Follow-up in 2 weeks.  Can reimage at that time.

## 2021-06-13 NOTE — Patient Instructions (Signed)
Nice to meet you Please continue the non weight bearing on the foot  Please try the scooter   Please send me a message in MyChart with any questions or updates.  Please see me back in 2 weeks.   --Dr. Jordan Likes

## 2021-06-13 NOTE — Assessment & Plan Note (Signed)
Initial injury on 7/23.  No displacement appreciated no widening of the Lisfranc joint or concern of the Lisfranc ligament being torn. -Counseled on supportive care. -Counseled on continuing nonweightbearing. -Cam walker today. -Rolling knee scooter. -Follow-up in 2 weeks.  Can reimage at that time.

## 2021-06-27 ENCOUNTER — Encounter: Payer: Self-pay | Admitting: Family Medicine

## 2021-06-27 ENCOUNTER — Other Ambulatory Visit: Payer: Self-pay

## 2021-06-27 ENCOUNTER — Ambulatory Visit: Payer: BC Managed Care – PPO | Admitting: Family Medicine

## 2021-06-27 ENCOUNTER — Ambulatory Visit (HOSPITAL_BASED_OUTPATIENT_CLINIC_OR_DEPARTMENT_OTHER)
Admission: RE | Admit: 2021-06-27 | Discharge: 2021-06-27 | Disposition: A | Discharge status: Home / Self Care | Payer: BC Managed Care – PPO | Source: Ambulatory Visit | Attending: Family Medicine | Admitting: Family Medicine

## 2021-06-27 VITALS — BP 150/80 | Ht 67.0 in | Wt 190.0 lb

## 2021-06-27 DIAGNOSIS — S92314D Nondisplaced fracture of first metatarsal bone, right foot, subsequent encounter for fracture with routine healing: Secondary | ICD-10-CM | POA: Diagnosis not present

## 2021-06-27 NOTE — Patient Instructions (Signed)
Good to see you Please use ice as needed  Please continue the boot  I will call with the results.   Please send me a message in MyChart with any questions or updates.  Please see me back in 4 weeks.   --Dr. Jordan Likes

## 2021-06-27 NOTE — Progress Notes (Signed)
  Amanda Ferrell - 45 y.o. female MRN [id number]  Date of birth: 16-Nov-1975  SUBJECTIVE:  Including CC & ROS.  No chief complaint on file.   Amanda Ferrell is a 45 y.o. female that is following up for her right foot fracture.  Swelling intermittently but doing well overall.  Denies any pain.    Review of Systems See HPI   HISTORY: Past Medical, Surgical, Social, and Family History Reviewed & Updated per EMR.   Pertinent Historical Findings include:  Past Medical History:  Diagnosis Date   Anxiety    Benign breast cyst in female    Depression     Past Surgical History:  Procedure Laterality Date   BREAST CYST ASPIRATION     Benign    Family History  Problem Relation Age of Onset   Heart attack Maternal Grandmother    Heart attack Paternal Aunt        smoker   Heart attack Paternal Grandfather        smoker    Social History   Socioeconomic History   Marital status: Married    Spouse name: Not on file   Number of children: 2   Years of education: Not on file   Highest education level: Not on file  Occupational History   Occupation: Admin Asst   Tobacco Use   Smoking status: Never   Smokeless tobacco: Never  Substance and Sexual Activity   Alcohol use: Yes    Comment: occasional   Drug use: No   Sexual activity: Yes    Partners: Male    Birth control/protection: Other-see comments    Comment: vasectomey  Other Topics Concern   Not on file  Social History Narrative   No regular exercise.    Social Determinants of Health   Financial Resource Strain: Not on file  Food Insecurity: Not on file  Transportation Needs: Not on file  Physical Activity: Not on file  Stress: Not on file  Social Connections: Not on file  Intimate Partner Violence: Not on file     PHYSICAL EXAM:  VS: BP (!) 150/80 (BP Location: Left Arm, Patient Position: Sitting, Cuff Size: Normal)   Ht 5\' 7"  (1.702 m)   Wt 190 lb (86.2 kg)   BMI 29.76 kg/m  Physical Exam Gen: NAD,  alert, cooperative with exam, well-appearing MSK:   Right foot: No swelling or ecchymosis. No tenderness to palpation. Neurovascular intact    ASSESSMENT & PLAN:   Nondisplaced fracture of first metatarsal bone, right foot, subsequent encounter for fracture with routine healing Initial injury on 7/23.  Doing well and little pain. -Counseled on home exercise therapy and supportive care. -X-ray. -Follow-up in 4 weeks.

## 2021-06-28 ENCOUNTER — Telehealth: Payer: Self-pay | Admitting: Family Medicine

## 2021-06-28 ENCOUNTER — Encounter: Payer: Self-pay | Admitting: Family Medicine

## 2021-06-28 NOTE — Assessment & Plan Note (Addendum)
Initial injury on 7/23.  Doing well and little pain. -Counseled on home exercise therapy and supportive care. -X-ray. -Follow-up in 4 weeks.

## 2021-06-28 NOTE — Telephone Encounter (Signed)
Informed of results   Myra Rude, MD [facility] 06/28/2021, 12:48 PM

## 2021-07-06 ENCOUNTER — Encounter: Payer: Self-pay | Admitting: Family Medicine

## 2021-07-25 ENCOUNTER — Ambulatory Visit: Payer: BC Managed Care – PPO | Admitting: Family Medicine

## 2021-07-25 ENCOUNTER — Other Ambulatory Visit: Payer: Self-pay

## 2021-07-25 ENCOUNTER — Ambulatory Visit (HOSPITAL_BASED_OUTPATIENT_CLINIC_OR_DEPARTMENT_OTHER)
Admission: RE | Admit: 2021-07-25 | Discharge: 2021-07-25 | Disposition: A | Discharge status: Home / Self Care | Payer: BC Managed Care – PPO | Source: Ambulatory Visit | Attending: Family Medicine | Admitting: Family Medicine

## 2021-07-25 DIAGNOSIS — S92314D Nondisplaced fracture of first metatarsal bone, right foot, subsequent encounter for fracture with routine healing: Secondary | ICD-10-CM

## 2021-07-25 NOTE — Assessment & Plan Note (Addendum)
Initial injury on 7/23.  No pain today. -Counseled on home exercise therapy and supportive care. -X-ray. -Could consider insoles.

## 2021-07-25 NOTE — Progress Notes (Signed)
  [name] - 45 y.o. female MRN [id number]  Date of birth: [birth year]  SUBJECTIVE:  Including CC & ROS.  No chief complaint on file.   Amanda Ferrell is a 45 y.o. female that is following up for her foot fracture.  She denies any pain today.  Having swelling intermittently.   Review of Systems See HPI   HISTORY: Past Medical, Surgical, Social, and Family History Reviewed & Updated per EMR.   Pertinent Historical Findings include:  Past Medical History:  Diagnosis Date   Anxiety    Benign breast cyst in female    Depression     Past Surgical History:  Procedure Laterality Date   BREAST CYST ASPIRATION     Benign    Family History  Problem Relation Age of Onset   Heart attack Maternal Grandmother    Heart attack Paternal Aunt        smoker   Heart attack Paternal Grandfather        smoker    Social History   Socioeconomic History   Marital status: Married    Spouse name: Not on file   Number of children: 2   Years of education: Not on file   Highest education level: Not on file  Occupational History   Occupation: Admin Asst   Tobacco Use   Smoking status: Never   Smokeless tobacco: Never  Substance and Sexual Activity   Alcohol use: Yes    Comment: occasional   Drug use: No   Sexual activity: Yes    Partners: Male    Birth control/protection: Other-see comments    Comment: vasectomey  Other Topics Concern   Not on file  Social History Narrative   No regular exercise.    Social Determinants of Health   Financial Resource Strain: Not on file  Food Insecurity: Not on file  Transportation Needs: Not on file  Physical Activity: Not on file  Stress: Not on file  Social Connections: Not on file  Intimate Partner Violence: Not on file     PHYSICAL EXAM:  VS: Ht 5\' 7"  (1.702 m)   Wt 187 lb (84.8 kg)   BMI 29.29 kg/m  Physical Exam Gen: NAD, alert, cooperative with exam, well-appearing      ASSESSMENT & PLAN:   Nondisplaced fracture  of first metatarsal bone, right foot, subsequent encounter for fracture with routine healing Initial injury on 7/23.  No pain today. -Counseled on home exercise therapy and supportive care. -X-ray. -Could consider insoles.

## 2021-07-25 NOTE — Patient Instructions (Signed)
Good to see you Please use ice as needed  Please try the exercises  I will call with the results from today   Please send me a message in MyChart with any questions or updates.  Please see me back as needed.   --Dr. Jordan Likes

## 2021-07-26 ENCOUNTER — Encounter: Payer: Self-pay | Admitting: Family Medicine

## 2022-01-09 ENCOUNTER — Other Ambulatory Visit: Payer: Self-pay | Admitting: Family Medicine

## 2022-04-03 ENCOUNTER — Other Ambulatory Visit: Payer: Self-pay | Admitting: Family Medicine

## 2022-04-12 ENCOUNTER — Other Ambulatory Visit: Payer: Self-pay | Admitting: Family Medicine

## 2022-04-12 DIAGNOSIS — Z1231 Encounter for screening mammogram for malignant neoplasm of breast: Secondary | ICD-10-CM

## 2022-04-13 ENCOUNTER — Ambulatory Visit (INDEPENDENT_AMBULATORY_CARE_PROVIDER_SITE_OTHER): Payer: BC Managed Care – PPO

## 2022-04-13 DIAGNOSIS — Z1231 Encounter for screening mammogram for malignant neoplasm of breast: Secondary | ICD-10-CM

## 2022-04-17 NOTE — Progress Notes (Signed)
Please call patient. Normal mammogram.  Repeat in 1 year.  

## 2022-04-18 ENCOUNTER — Encounter: Payer: Self-pay | Admitting: Family Medicine

## 2022-04-18 ENCOUNTER — Ambulatory Visit (INDEPENDENT_AMBULATORY_CARE_PROVIDER_SITE_OTHER): Payer: BC Managed Care – PPO | Admitting: Family Medicine

## 2022-04-18 VITALS — BP 127/71 | HR 64 | Ht 67.0 in | Wt 183.0 lb

## 2022-04-18 DIAGNOSIS — Z0000 Encounter for general adult medical examination without abnormal findings: Secondary | ICD-10-CM | POA: Diagnosis not present

## 2022-04-18 NOTE — Progress Notes (Signed)
Complete physical exam  Patient: Amanda Ferrell   DOB: Feb 01, 1976   46 y.o. Female  MRN: [id number]  Subjective:    Chief Complaint  Patient presents with   Annual Exam    Amanda Ferrell is a 46 y.o. female who presents today for a complete physical exam. She reports consuming a general diet.  Does exercise regularly   She generally feels well. She reports sleeping well. She does not have additional problems to discuss today.  She just had her mammogram last week.  Pap smear is up-to-date.   Most recent fall risk assessment:    04/18/2022    3:06 PM  Fall Risk   Falls in the past year? 1  Number falls in past yr: 0  Injury with Fall? 1  Risk for fall due to : No Fall Risks  Follow up Falls prevention discussed     Most recent depression screenings:    04/18/2022    3:06 PM 02/10/2020    3:31 PM  PHQ 2/9 Scores  PHQ - 2 Score 0 0      Past Surgical History:  Procedure Laterality Date   BREAST CYST ASPIRATION     Benign   Social History   Tobacco Use   Smoking status: Never   Smokeless tobacco: Never  Substance Use Topics   Alcohol use: Yes    Comment: occasional   Drug use: No   Family History  Problem Relation Age of Onset   Heart attack Maternal Grandmother    Heart attack Paternal Aunt        smoker   Heart attack Paternal Grandfather        smoker      Patient Care Team: Agapito Games, MD as PCP - General (Family Medicine)   Outpatient Medications Prior to Visit  Medication Sig   LARIN FE 1.5/30 1.5-30 MG-MCG tablet Take 1 tablet by mouth once daily   pantoprazole (PROTONIX) 40 MG tablet Take 1 tablet by mouth twice daily   No facility-administered medications prior to visit.    ROS        Objective:     BP 127/71   Pulse 64   Ht 5\' 7"  (1.702 m)   Wt 183 lb (83 kg)   SpO2 100%   BMI 28.66 kg/m     Physical Exam Vitals and nursing note reviewed.  Constitutional:      Appearance: She is well-developed.  HENT:      Head: Normocephalic and atraumatic.     Right Ear: Ear canal and external ear normal.     Left Ear: Ear canal and external ear normal.     Ears:     Comments: TMs blocked by cerumen.     Nose: Nose normal.  Eyes:     Conjunctiva/sclera: Conjunctivae normal.     Pupils: Pupils are equal, round, and reactive to light.  Neck:     Thyroid: No thyromegaly.  Cardiovascular:     Rate and Rhythm: Normal rate and regular rhythm.     Heart sounds: Normal heart sounds.  Pulmonary:     Effort: Pulmonary effort is normal.     Breath sounds: Normal breath sounds. No wheezing.  Musculoskeletal:     Cervical back: Neck supple.  Lymphadenopathy:     Cervical: No cervical adenopathy.  Skin:    General: Skin is warm and dry.     Coloration: Skin is not pale.  Neurological:  Mental Status: She is alert and oriented to person, place, and time.  Psychiatric:        Behavior: Behavior normal.      No results found for any visits on 04/18/22.      Assessment & Plan:    Routine Health Maintenance and Physical Exam  Immunization History  Administered Date(s) Administered   Tdap 06/14/2011    Health Maintenance  Topic Date Due   HIV Screening  Never done   Hepatitis C Screening  Never done   TETANUS/TDAP  06/13/2021   INFLUENZA VACCINE  06/06/2022   PAP SMEAR-Modifier  03/17/2026   Pneumococcal Vaccine 78-49 Years old  Aged Out   HPV VACCINES  Aged Out    Discussed health benefits of physical activity, and encouraged her to engage in regular exercise appropriate for her age and condition.  Problem List Items Addressed This Visit   None Visit Diagnoses     Wellness examination    -  Primary   Relevant Orders   TSH   COMPLETE METABOLIC PANEL WITH GFR   Lipid Panel w/reflex Direct LDL       Keep up a regular exercise program and make sure you are eating a healthy diet Try to eat 4 servings of dairy a day, or if you are lactose intolerant take a calcium with vitamin D  daily.  Your vaccines are up to date.   The couple of seborrheic keratoses that she would like removed 1 is particularly bothersome because it is very raised.  Recommend return for cryotherapy at her convenience.  Return in about 1 year (around 04/19/2023) for Wellness Exam.     Nani Gasser, MD

## 2022-04-22 LAB — COMPLETE METABOLIC PANEL WITH GFR
AG Ratio: 1.8 (calc) (ref 1.0–2.5)
ALT: 14 U/L (ref 6–29)
AST: 16 U/L (ref 10–30)
Albumin: 3.9 g/dL (ref 3.6–5.1)
Alkaline phosphatase (APISO): 38 U/L (ref 31–125)
BUN: 10 mg/dL (ref 7–25)
CO2: 26 mmol/L (ref 20–32)
Calcium: 8.9 mg/dL (ref 8.6–10.2)
Chloride: 105 mmol/L (ref 98–110)
Creat: 0.76 mg/dL (ref 0.50–0.99)
Globulin: 2.2 g/dL (calc) (ref 1.9–3.7)
Glucose, Bld: 83 mg/dL (ref 65–99)
Potassium: 4.2 mmol/L (ref 3.5–5.3)
Sodium: 138 mmol/L (ref 135–146)
Total Bilirubin: 0.5 mg/dL (ref 0.2–1.2)
Total Protein: 6.1 g/dL (ref 6.1–8.1)
eGFR: 99 mL/min/{1.73_m2} (ref >=60)

## 2022-04-22 LAB — LIPID PANEL W/REFLEX DIRECT LDL
Cholesterol: 185 mg/dL (ref <200)
HDL: 57 mg/dL (ref >=50)
LDL Cholesterol (Calc): 113 mg/dL (calc) — ABNORMAL HIGH
Non-HDL Cholesterol (Calc): 128 mg/dL (calc) (ref <130)
Total CHOL/HDL Ratio: 3.2 (calc) (ref <5.0)
Triglycerides: 66 mg/dL (ref <150)

## 2022-04-22 LAB — TSH: TSH: 2.39 mIU/L

## 2022-04-24 NOTE — Progress Notes (Signed)
HI Amanda Ferrell, your LDL cholesterol is up slightly compared to 2 years ago.  Just above normal. Encourage you to work on healthy diet and regular exercise. Your metabolic panel and thyroid look great.

## 2022-04-26 ENCOUNTER — Ambulatory Visit: Payer: BC Managed Care – PPO

## 2022-05-21 ENCOUNTER — Other Ambulatory Visit: Payer: Self-pay | Admitting: Family Medicine

## 2022-05-21 ENCOUNTER — Encounter: Payer: Self-pay | Admitting: Family Medicine

## 2022-05-22 MED ORDER — PANTOPRAZOLE SODIUM 40 MG PO TBEC: 40.0000 mg | DELAYED_RELEASE_TABLET | Freq: Two times a day (BID) | ORAL | 1 refills | Status: DC

## 2022-05-22 MED ORDER — NORETHIN ACE-ETH ESTRAD-FE 1.5-30 MG-MCG PO TABS: 1.0000 | ORAL_TABLET | Freq: Every day | ORAL | 2 refills | Status: DC

## 2022-10-13 ENCOUNTER — Encounter: Payer: Self-pay | Admitting: Family Medicine

## 2022-10-24 NOTE — Progress Notes (Unsigned)
   Established Patient Office Visit  Subjective   Patient ID: Amanda Ferrell, female    DOB: 1976-04-29  Age: 46 y.o. MRN: [id number]  No chief complaint on file.   HPI  {History (Optional):23778}  ROS    Objective:     There were no vitals taken for this visit. {Vitals History (Optional):23777}  Physical Exam   No results found for any visits on 10/25/22.  {Labs (Optional):23779}  The 10-year ASCVD risk score (Arnett DK, et al., 2019) is: 0.6%    Assessment & Plan:   Problem List Items Addressed This Visit   None Visit Diagnoses     Hair thinning    -  Primary       No follow-ups on file.    Nani Gasser, MD

## 2022-10-25 ENCOUNTER — Ambulatory Visit: Payer: BC Managed Care – PPO | Admitting: Family Medicine

## 2022-10-25 ENCOUNTER — Encounter: Payer: Self-pay | Admitting: Family Medicine

## 2022-10-25 VITALS — BP 117/77 | HR 80 | Ht 67.0 in | Wt 179.0 lb

## 2022-10-25 DIAGNOSIS — L659 Nonscarring hair loss, unspecified: Secondary | ICD-10-CM | POA: Diagnosis not present

## 2022-10-25 DIAGNOSIS — Z1212 Encounter for screening for malignant neoplasm of rectum: Secondary | ICD-10-CM

## 2022-10-25 DIAGNOSIS — Z1211 Encounter for screening for malignant neoplasm of colon: Secondary | ICD-10-CM

## 2022-10-26 NOTE — Progress Notes (Signed)
Hi Amanda Ferrell,  Your total iron is a little bit on the higher end.  I am not sure if you take extra iron.  If you do then you can decrease how often you are taking it.  It is not an excessive or worrisome range that we would typically see with a medical condition called hemochromatosis.  But we can keep an eye on it.  Your vitamin B12 looks great!  Your thyroid and folate also look great.  Biotin is still pending.

## 2022-11-01 LAB — IRON,TIBC AND FERRITIN PANEL
%SAT: 58 % (calc) — ABNORMAL HIGH (ref 16–45)
Ferritin: 102 ng/mL (ref 16–232)
Iron: 194 ug/dL — ABNORMAL HIGH (ref 40–190)
TIBC: 332 mcg/dL (calc) (ref 250–450)

## 2022-11-01 LAB — TSH: TSH: 2.58 mIU/L

## 2022-11-01 LAB — FOLATE: Folate: 20.3 ng/mL

## 2022-11-01 LAB — VITAMIN B12: Vitamin B-12: 455 pg/mL (ref 200–1100)

## 2022-11-01 LAB — BIOTIN (VITAMIN B7): Biotin (Vitamin B7): 2995.1 pg/mL (ref 221.0–3004.0)

## 2022-11-03 NOTE — Progress Notes (Signed)
Or biotin looks great as well!  Now that you are 46 I would recommend colon cancer screening for you.  Happy birthday!.  Please consider doing either a colonoscopy or Cologuard which is a stool test.  If your Cologuard is normal the results are good for 3 years.  If your colonoscopy is normal your results are good for 10 years.  Please let me know if you have a preference.

## 2022-11-03 NOTE — Addendum Note (Signed)
Addended by: Ardyth Man on: 11/03/2022 03:40 PM   Modules accepted: Orders

## 2022-11-07 ENCOUNTER — Encounter: Payer: Self-pay | Admitting: Family Medicine

## 2022-11-21 ENCOUNTER — Other Ambulatory Visit: Payer: Self-pay | Admitting: Sports Medicine

## 2022-11-21 MED ORDER — OSELTAMIVIR PHOSPHATE 75 MG PO CAPS: 75.0000 mg | ORAL_CAPSULE | Freq: Every day | ORAL | 0 refills | Status: DC

## 2022-11-21 MED ORDER — ONDANSETRON 8 MG PO TBDP: 8.0000 mg | ORAL_TABLET | Freq: Three times a day (TID) | ORAL | 3 refills | Status: DC | PRN

## 2022-11-28 LAB — COLOGUARD: COLOGUARD: NEGATIVE

## 2022-11-29 NOTE — Progress Notes (Signed)
Great news! Your Cologuard test is negative.  Recommend repeat colon cancer screening in 3 years.

## 2022-12-21 IMAGING — MG MM DIGITAL SCREENING BILAT W/ TOMO AND CAD
6 of 12 series · 6 of 36 positions shown · non-contrast
Comparison: Previous exam(s).

CLINICAL DATA: Screening.

EXAM:
DIGITAL SCREENING BILATERAL MAMMOGRAM WITH TOMOSYNTHESIS AND CAD
TECHNIQUE: Bilateral screening digital craniocaudal and mediolateral oblique
mammograms were obtained. Bilateral screening digital breast
tomosynthesis was performed. The images were evaluated with
computer-aided detection.

[R CC synth-2D]
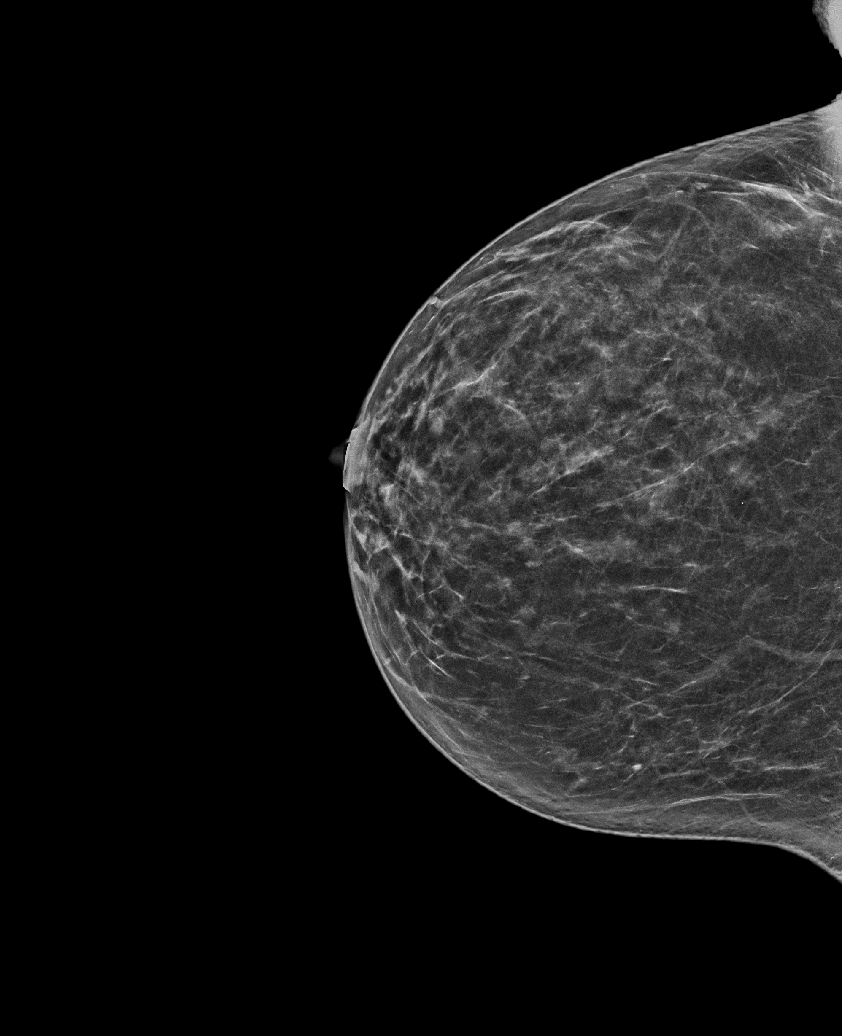

[L CC synth-2D]
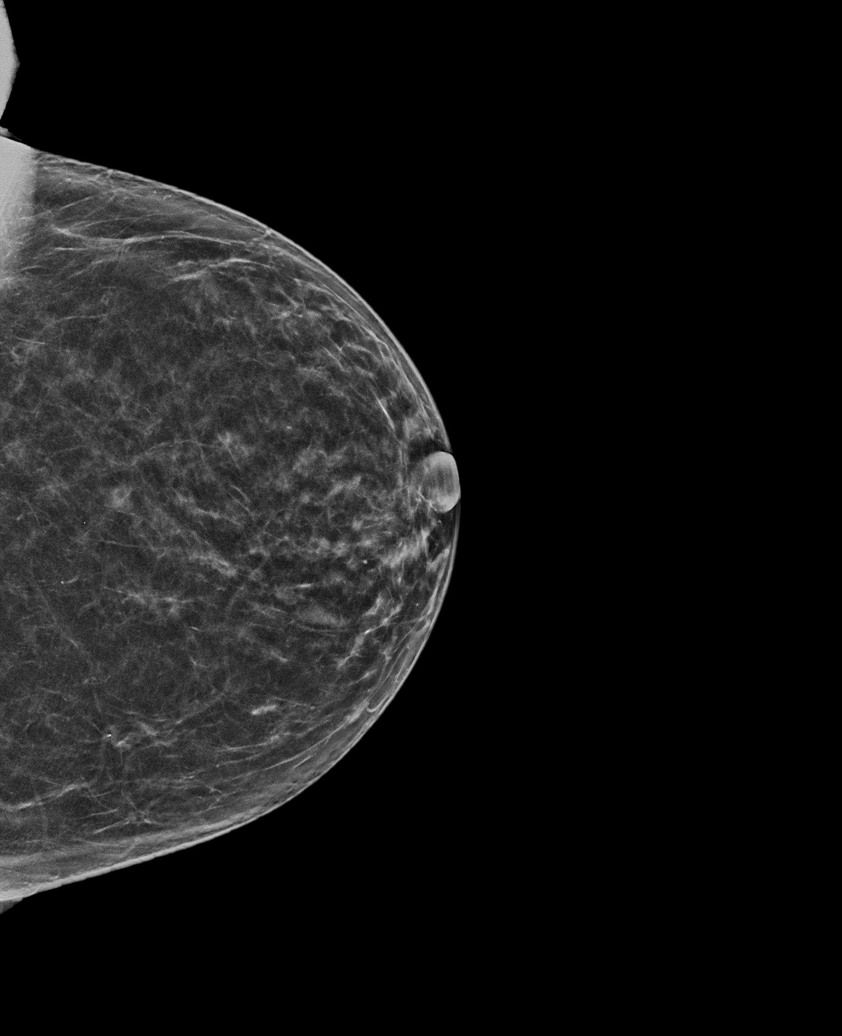

[L MLO synth-2D (1 of 2)]
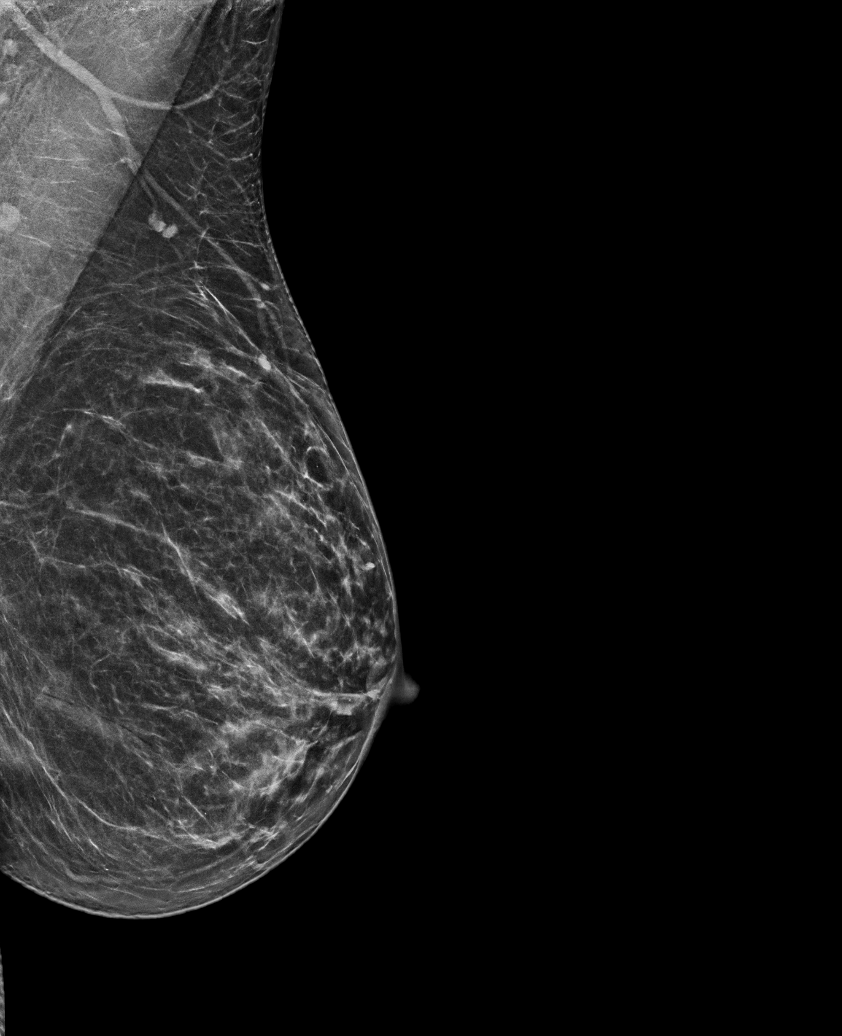

[L XCCL synth-2D]
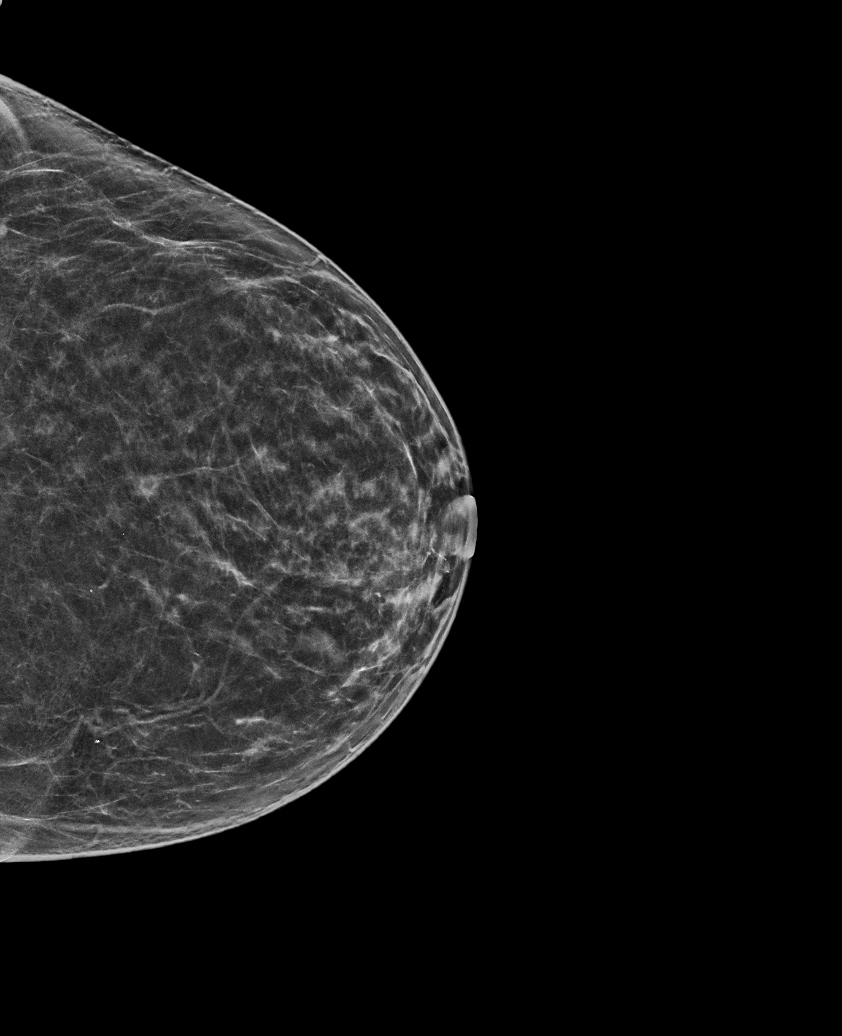

[R MLO synth-2D]
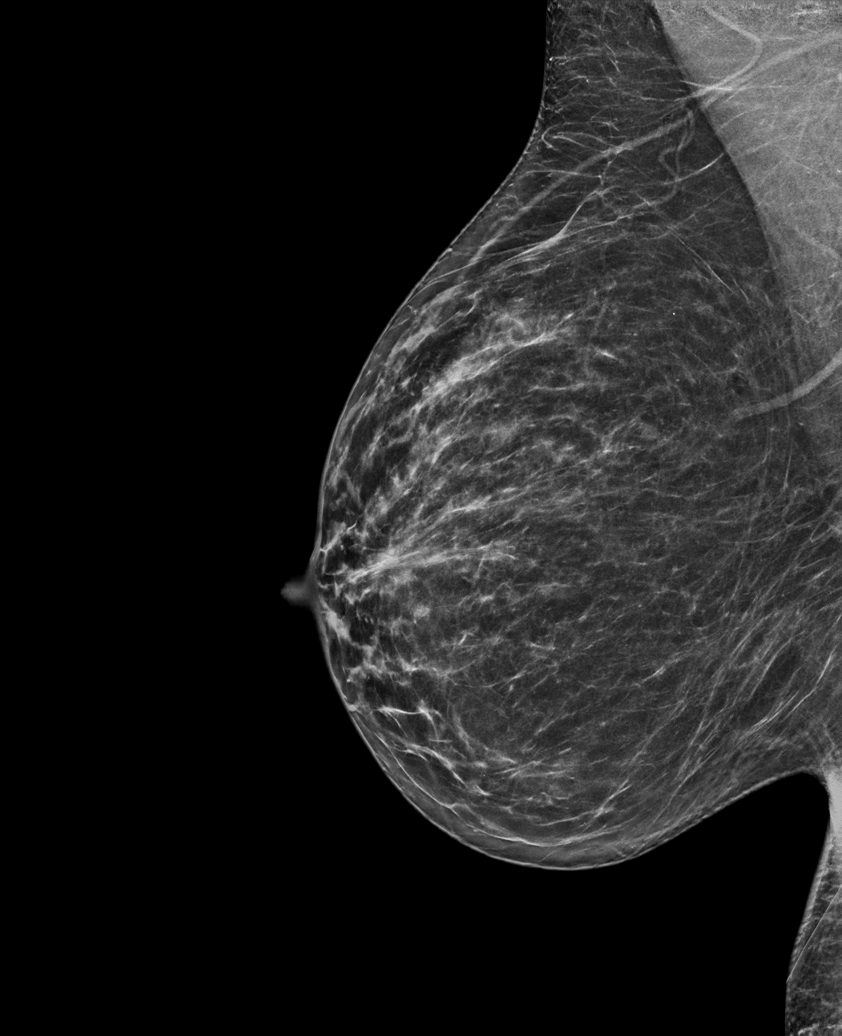

[L MLO synth-2D (2 of 2)]
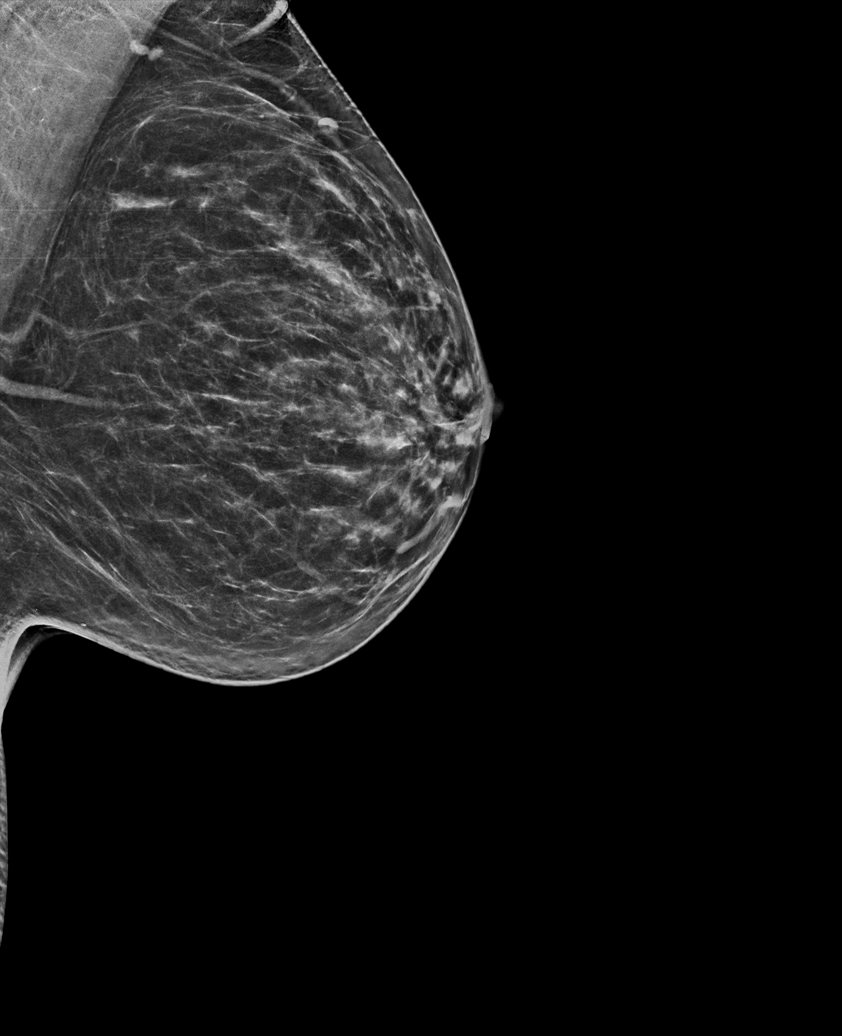

[6 of 36 positions shown; findings below may reference images not displayed]

ACR Breast Density Category b: There are scattered areas of
fibroglandular density.
FINDINGS: There are no findings suspicious for malignancy.
IMPRESSION: No mammographic evidence of malignancy. A result letter of this
screening mammogram will be mailed directly to the patient.

RECOMMENDATION:
Screening mammogram in one year. (Code:51-O-LD2)

BI-RADS CATEGORY  1: Negative.

## 2023-01-11 ENCOUNTER — Encounter: Payer: Self-pay | Admitting: Family Medicine

## 2023-01-11 ENCOUNTER — Ambulatory Visit: Payer: BC Managed Care – PPO | Admitting: Family Medicine

## 2023-01-11 VITALS — BP 118/63 | HR 97 | Ht 67.0 in | Wt 184.1 lb

## 2023-01-11 DIAGNOSIS — F411 Generalized anxiety disorder: Secondary | ICD-10-CM | POA: Diagnosis not present

## 2023-01-11 MED ORDER — SERTRALINE HCL 25 MG PO TABS: 25.0000 mg | ORAL_TABLET | Freq: Every day | ORAL | 0 refills | Status: DC

## 2023-01-11 NOTE — Progress Notes (Deleted)
b

## 2023-01-11 NOTE — Assessment & Plan Note (Signed)
We discussed options for treatment.  Paxil caused her to feel numb in the past and she is willing to try sertraline we will start with just a low-dose and then see her back in about 3 weeks.  We can see if the medication is starting to be effective and then make any adjustments needed at that time.  Will monitor her sleep quality.  Encouraged her to think about counseling/therapy as an option she will let me know when we speak again.

## 2023-01-11 NOTE — Progress Notes (Signed)
Acute Office Visit  Subjective:     Patient ID: Amanda Ferrell, female    DOB: [birth year], 47 y.o.   MRN: [id number]  Chief Complaint  Patient presents with   Anxiety    HPI Patient is in today for Anxiety -feels ike she has had anxiety her whole life. In he 91s she took Paxil for a period of time but she said it made her feel very numb and so eventually quit taking it.  Even her husband noticed.  But more recently she does feels like her anxiety has really ramped up the more intrusive thoughts.  And even feeling short of breath at times.  She is also been experiencing some hair loss.  She does not feel like her life stressors are any more than usual nothing specific triggering it.  She is not sleeping well it makes it difficult to fall asleep because her mind races.  She is open to taking medication.  Not currently interested in therapy but is willing to consider it.       01/11/2023    2:46 PM 01/11/2023    2:37 PM 01/11/2023    2:31 PM 02/10/2020    3:31 PM  GAD 7 : Generalized Anxiety Score  Nervous, Anxious, on Edge 3 3 3 1   Control/stop worrying 3 3 3  0  Worry too much - different things 3 3 3 1   Trouble relaxing 2 2 2 1   Restless 1 1 1  0  Easily annoyed or irritable 1 1 1 1   Afraid - awful might happen 2 2 2  0  Total GAD 7 Score 15 15 15 4   Anxiety Difficulty Somewhat difficult Somewhat difficult Somewhat difficult Not difficult at all       01/11/2023    2:38 PM 01/11/2023    2:30 PM 04/18/2022    3:06 PM  Depression screen PHQ 2/9  Decreased Interest 1 1 0  Down, Depressed, Hopeless 2 2 0  PHQ - 2 Score 3 3 0  Altered sleeping 1 1   Tired, decreased energy 1 1   Change in appetite 0 0   Feeling bad or failure about yourself  1 1   Trouble concentrating 2 2   Moving slowly or fidgety/restless 0 0   Suicidal thoughts 0 0   PHQ-9 Score 8 8   Difficult doing work/chores Somewhat difficult Somewhat difficult     ROS      Objective:    BP 118/63   Pulse 97   Ht  5\' 7"  (1.702 m)   Wt 184 lb 1.9 oz (83.5 kg)   SpO2 100%   BMI 28.84 kg/m    Physical Exam Vitals and nursing note reviewed.  Constitutional:      Appearance: She is well-developed.  HENT:     Head: Normocephalic and atraumatic.  Cardiovascular:     Rate and Rhythm: Normal rate and regular rhythm.     Heart sounds: Normal heart sounds.  Pulmonary:     Effort: Pulmonary effort is normal.     Breath sounds: Normal breath sounds.  Skin:    General: Skin is warm and dry.  Neurological:     Mental Status: She is alert and oriented to person, place, and time.  Psychiatric:        Behavior: Behavior normal.     No results found for any visits on 01/11/23.      Assessment & Plan:   Problem List Items Addressed This Visit  Other   GAD (generalized anxiety disorder) - Primary    We discussed options for treatment.  Paxil caused her to feel numb in the past and she is willing to try sertraline we will start with just a low-dose and then see her back in about 3 weeks.  We can see if the medication is starting to be effective and then make any adjustments needed at that time.  Will monitor her sleep quality.  Encouraged her to think about counseling/therapy as an option she will let me know when we speak again.      Relevant Medications   sertraline (ZOLOFT) 25 MG tablet    Meds ordered this encounter  Medications   sertraline (ZOLOFT) 25 MG tablet    Sig: Take 1 tablet (25 mg total) by mouth daily.    Dispense:  30 tablet    Refill:  0    Return in about 3 weeks (around 02/01/2023) for New start medication.  Beatrice Lecher, MD

## 2023-02-01 ENCOUNTER — Ambulatory Visit: Payer: BC Managed Care – PPO | Admitting: Family Medicine

## 2023-02-01 ENCOUNTER — Encounter: Payer: Self-pay | Admitting: Family Medicine

## 2023-02-01 VITALS — BP 112/59 | HR 70 | Ht 67.0 in | Wt 183.0 lb

## 2023-02-01 DIAGNOSIS — F411 Generalized anxiety disorder: Secondary | ICD-10-CM | POA: Diagnosis not present

## 2023-02-01 DIAGNOSIS — K219 Gastro-esophageal reflux disease without esophagitis: Secondary | ICD-10-CM

## 2023-02-01 DIAGNOSIS — R12 Heartburn: Secondary | ICD-10-CM | POA: Insufficient documentation

## 2023-02-01 MED ORDER — PANTOPRAZOLE SODIUM 40 MG PO TBEC: DELAYED_RELEASE_TABLET | ORAL | 3 refills | Status: DC

## 2023-02-01 MED ORDER — SERTRALINE HCL 50 MG PO TABS: 50.0000 mg | ORAL_TABLET | Freq: Every day | ORAL | 0 refills | Status: DC

## 2023-02-01 NOTE — Assessment & Plan Note (Signed)
We discussed options.  I am actually seeing some great benefit thus far and some reduction in her current symptoms for depression and anxiety.  I like to go ahead and bump up to 50 mg of sertraline.  Call if any problems or concerns otherwise will follow back up in 6 weeks to discuss options and make sure that she is at a therapeutic goal or make adjustments if needed.  Monitor for any new onset symptoms or side effects.

## 2023-02-01 NOTE — Progress Notes (Signed)
   Established Patient Office Visit  Subjective   Patient ID: Amanda Ferrell, female    DOB: September 14, 1976  Age: 47 y.o. MRN: [id number]  Chief Complaint  Patient presents with   Anxiety    HPI  Here for follow-up on anxiety.  Currently on sertraline 25 mg daily.  Previous GAD-7 score was 15 down to 9 today.  Previous PHQ-9 score of 8 down to 5.  She taken Paxil years ago but actually felt really flat on it.  She came in because she just felt like her anxiety has really ramped up and she been having more intrusive thoughts.  She does feel like the medication is starting to help and she has noticed some improvements.  No thoughts of wanting to harm herself.  She has not had any side effects or concerns with the medication thus far.  Currently taking sertraline 25 mg.    ROS    Objective:     BP (!) 112/59   Pulse 70   Ht 5\' 7"  (1.702 m)   Wt 183 lb (83 kg)   SpO2 100%   BMI 28.66 kg/m    Physical Exam Vitals and nursing note reviewed.  Constitutional:      Appearance: She is well-developed.  HENT:     Head: Normocephalic and atraumatic.  Cardiovascular:     Rate and Rhythm: Normal rate and regular rhythm.     Heart sounds: Normal heart sounds.  Pulmonary:     Effort: Pulmonary effort is normal.     Breath sounds: Normal breath sounds.  Skin:    General: Skin is warm and dry.  Neurological:     Mental Status: She is alert and oriented to person, place, and time.  Psychiatric:        Behavior: Behavior normal.      No results found for any visits on 02/01/23.    The 10-year ASCVD risk score (Arnett DK, et al., 2019) is: 0.5%    Assessment & Plan:   Problem List Items Addressed This Visit       Other   Heartburn    She did need a refill on her pantoprazole today.  Prescription sent to pharmacy.      Relevant Medications   pantoprazole (PROTONIX) 40 MG tablet   GAD (generalized anxiety disorder)    We discussed options.  I am actually seeing some  great benefit thus far and some reduction in her current symptoms for depression and anxiety.  I like to go ahead and bump up to 50 mg of sertraline.  Call if any problems or concerns otherwise will follow back up in 6 weeks to discuss options and make sure that she is at a therapeutic goal or make adjustments if needed.  Monitor for any new onset symptoms or side effects.      Relevant Medications   sertraline (ZOLOFT) 50 MG tablet   Other Visit Diagnoses     Gastroesophageal reflux disease, unspecified whether esophagitis present    -  Primary   Relevant Medications   pantoprazole (PROTONIX) 40 MG tablet       Return in about 6 weeks (around 03/15/2023) for Mood/change in dose.    Beatrice Lecher, MD

## 2023-02-01 NOTE — Assessment & Plan Note (Signed)
She did need a refill on her pantoprazole today.  Prescription sent to pharmacy.

## 2023-02-01 NOTE — Progress Notes (Signed)
Previous PHQ=8/SWD  GAD=15/SWD  Doing well on current dose of Sertraline 25 mg.

## 2023-02-19 ENCOUNTER — Encounter: Payer: Self-pay | Admitting: Family Medicine

## 2023-02-19 ENCOUNTER — Encounter: Payer: Self-pay | Admitting: *Deleted

## 2023-02-19 DIAGNOSIS — F411 Generalized anxiety disorder: Secondary | ICD-10-CM

## 2023-02-19 MED ORDER — SERTRALINE HCL 50 MG PO TABS: 75.0000 mg | ORAL_TABLET | Freq: Every day | ORAL | 1 refills | Status: DC

## 2023-02-22 ENCOUNTER — Ambulatory Visit: Payer: BC Managed Care – PPO | Admitting: Family Medicine

## 2023-02-22 ENCOUNTER — Encounter: Payer: Self-pay | Admitting: Family Medicine

## 2023-02-22 VITALS — BP 120/69 | HR 84 | Ht 67.0 in | Wt 183.0 lb

## 2023-02-22 DIAGNOSIS — R3915 Urgency of urination: Secondary | ICD-10-CM | POA: Diagnosis not present

## 2023-02-22 DIAGNOSIS — R319 Hematuria, unspecified: Secondary | ICD-10-CM

## 2023-02-22 DIAGNOSIS — M545 Low back pain, unspecified: Secondary | ICD-10-CM

## 2023-02-22 LAB — POCT URINALYSIS DIP (CLINITEK)
Bilirubin, UA: NEGATIVE
Glucose, UA: NEGATIVE mg/dL
Ketones, POC UA: NEGATIVE mg/dL
Leukocytes, UA: NEGATIVE
Nitrite, UA: NEGATIVE
POC PROTEIN,UA: NEGATIVE
Spec Grav, UA: 1.015 (ref 1.010–1.025)
Urobilinogen, UA: 0.2 E.U./dL
pH, UA: 6.5 (ref 5.0–8.0)

## 2023-02-22 MED ORDER — CELECOXIB 200 MG PO CAPS: 200.0000 mg | ORAL_CAPSULE | Freq: Two times a day (BID) | ORAL | 1 refills | Status: DC

## 2023-02-22 NOTE — Progress Notes (Signed)
Acute Office Visit  Subjective:     Patient ID: Amanda Ferrell, female    DOB: Nov 26, 1975, 47 y.o.   MRN: [id number]  Chief Complaint  Patient presents with   Back Pain    X 1day L sided   Urinary Urgency    X 1 day    HPI Patient is in today for left-sided low back pain that started yesterday.  She says pretty much every month around her menstrual cycle are little bit before she will have some back pain in that area it usually goes away after a few days.  But this time it was much more intense.  She says she tends to have urinary frequency because she drinks a lot of water but feels like the frequency and urgency has been more so in the last couple days she has not had any dysuria or hematuria.  No fevers chills or sweats.  Did try taking 100 mg of ibuprofen yesterday and it did not help with the back pain.  When just sitting she can feel a discomfort in that area.  ROS      Objective:    BP 120/69   Pulse 84   Ht  (1.702 m)   Wt 183 lb (83 kg)   SpO2 99%   BMI 28.66 kg/m    Physical Exam Vitals reviewed.  Constitutional:      Appearance: She is well-developed.  HENT:     Head: Normocephalic and atraumatic.  Eyes:     Conjunctiva/sclera: Conjunctivae normal.  Cardiovascular:     Rate and Rhythm: Normal rate.  Pulmonary:     Effort: Pulmonary effort is normal.  Musculoskeletal:     Comments: No significant tenderness directly over the lumbar spine or the left SI joint but she does have pain in that area.  Negative straight leg raise.  Normal flexion extension rotation and sidebending.  That she did have pain with rotation and sidebending.  Skin:    General: Skin is dry.     Coloration: Skin is not pale.  Neurological:     Mental Status: She is alert and oriented to person, place, and time.  Psychiatric:        Behavior: Behavior normal.     Results for orders placed or performed in visit on 02/22/23  POCT URINALYSIS DIP (CLINITEK)  Result Value Ref  Range   Color, UA yellow yellow   Clarity, UA clear clear   Glucose, UA negative negative mg/dL   Bilirubin, UA negative negative   Ketones, POC UA negative negative mg/dL   Spec Grav, UA 4.098 1.191 - 1.025   Blood, UA small (A) negative   pH, UA 6.5 5.0 - 8.0   POC PROTEIN,UA negative negative, trace   Urobilinogen, UA 0.2 0.2 or 1.0 E.U./dL   Nitrite, UA Negative Negative   Leukocytes, UA Negative Negative        Assessment & Plan:   Problem List Items Addressed This Visit   None Visit Diagnoses     Urgency of urination    -  Primary   Relevant Orders   POCT URINALYSIS DIP (CLINITEK) (Completed)   Urine Culture   Urinalysis, microscopic only   Acute left-sided low back pain without sciatica       Relevant Medications   celecoxib (CELEBREX) 200 MG capsule   Hematuria, unspecified type       Relevant Orders   Urine Culture   Urinalysis, microscopic only  Left-sided low back pain-it is more consistent with musculoskeletal issue.  Recommend switching to Celebrex since Advil did not help.  Okay to overlap with Tylenol for extra pain control.  Handout provided for additional stretches to do on her own at home over the weekend if not improving then please let us know and we will consider x-ray for further workup.  Urinary frequency/urgency-there was some hematuria on the urinalysis we will send for microscopic review as well as culture.  Since she is not having any dysuria or hematuria we will hold off on antibiotic treatment at this point.  Meds ordered this encounter  Medications   celecoxib (CELEBREX) 200 MG capsule    Sig: Take 1 capsule (200 mg total) by mouth 2 (two) times daily.    Dispense:  30 capsule    Refill:  1    No follow-ups on file.  Nani Gasser, MD

## 2023-02-23 LAB — URINE CULTURE
MICRO NUMBER:: 14843350
SPECIMEN QUALITY:: ADEQUATE

## 2023-02-23 LAB — URINALYSIS, MICROSCOPIC ONLY
Bacteria, UA: NONE SEEN /HPF
Hyaline Cast: NONE SEEN /LPF
RBC / HPF: NONE SEEN /HPF (ref 0–2)
WBC, UA: NONE SEEN /HPF (ref 0–5)

## 2023-02-23 NOTE — Progress Notes (Signed)
HI Amanda Ferrell, there were no whole red blood cells on the microscopic review which is very reassuring.  Culture still pending.

## 2023-02-26 NOTE — Progress Notes (Signed)
Hi Amanda Ferrell, urine culture is negative.  No sign of urinary tract infection.  No whole red blood cells in the urine either.

## 2023-02-28 ENCOUNTER — Encounter: Payer: Self-pay | Admitting: Family Medicine

## 2023-03-15 ENCOUNTER — Ambulatory Visit: Payer: BC Managed Care – PPO | Admitting: Family Medicine

## 2023-04-03 ENCOUNTER — Ambulatory Visit: Payer: BC Managed Care – PPO | Admitting: Family Medicine

## 2023-04-03 ENCOUNTER — Encounter: Payer: Self-pay | Admitting: Family Medicine

## 2023-04-03 VITALS — BP 106/63 | HR 85 | Temp 98.0°F

## 2023-04-03 DIAGNOSIS — J069 Acute upper respiratory infection, unspecified: Secondary | ICD-10-CM

## 2023-04-03 DIAGNOSIS — R051 Acute cough: Secondary | ICD-10-CM

## 2023-04-03 LAB — POCT INFLUENZA A/B
Influenza A, POC: NEGATIVE
Influenza B, POC: NEGATIVE

## 2023-04-03 LAB — POC COVID19 BINAXNOW: SARS Coronavirus 2 Ag: NEGATIVE

## 2023-04-03 NOTE — Progress Notes (Signed)
Sxs x4 days. She has only been taking mucinex sinus.

## 2023-04-03 NOTE — Patient Instructions (Signed)
If your symptoms are not any better over the next few days please call our office and let us know.

## 2023-04-03 NOTE — Progress Notes (Signed)
   Acute Office Visit  Subjective:     Patient ID: Amanda Ferrell, female    DOB: [birth year], 47 y.o.   MRN: [id number]  Chief Complaint  Patient presents with   Sore Throat   Cough    HPI Patient is in today for sinus symptoms and sore throat.  She said she started to feel little bit of drainage and congestion starting last Wednesday night.  Then she had a day where she felt a little better but then Friday started to feel bad again with a sore throat.  Saturday she actually lost her voice and ran a fever on Saturday right around 100.  She is just felt really tired.  She started to feel a little better yesterday but then this morning felt worse again.  She has a mild cough mostly from the drainage.  ROS      Objective:    BP 106/63   Pulse 85   Temp 98 F (36.7 C)   SpO2 100%    Physical Exam Constitutional:      Appearance: She is well-developed.  HENT:     Head: Normocephalic and atraumatic.     Right Ear: Ear canal and external ear normal.     Left Ear: Ear canal and external ear normal.     Ears:     Comments: Blocked by cerumen    Nose: Nose normal.     Mouth/Throat:     Pharynx: Oropharynx is clear. No pharyngeal swelling or oropharyngeal exudate.     Tonsils: No tonsillar exudate.  Eyes:     Conjunctiva/sclera: Conjunctivae normal.     Pupils: Pupils are equal, round, and reactive to light.  Neck:     Thyroid: No thyromegaly.  Cardiovascular:     Rate and Rhythm: Normal rate and regular rhythm.     Heart sounds: Normal heart sounds.  Pulmonary:     Effort: Pulmonary effort is normal.     Breath sounds: Normal breath sounds. No wheezing.  Musculoskeletal:     Cervical back: Neck supple.  Lymphadenopathy:     Cervical: No cervical adenopathy.  Skin:    General: Skin is warm and dry.  Neurological:     Mental Status: She is alert and oriented to person, place, and time.  Psychiatric:        Mood and Affect: Mood normal.     Results for orders  placed or performed in visit on 04/03/23  POC COVID-19  Result Value Ref Range   SARS Coronavirus 2 Ag Negative Negative  POCT Influenza A/B  Result Value Ref Range   Influenza A, POC Negative Negative   Influenza B, POC Negative Negative        Assessment & Plan:   Problem List Items Addressed This Visit   None Visit Diagnoses     Acute cough    -  Primary   Relevant Orders   POC COVID-19 (Completed)   POCT Influenza A/B (Completed)   Viral upper respiratory tract infection           URI - neg for flu and COVID. Likely viral. Rec symptomatic care. Call if not better by the end fo the week. Work note provided.    No orders of the defined types were placed in this encounter.   Return if symptoms worsen or fail to improve.  Nani Gasser, MD

## 2023-04-16 ENCOUNTER — Telehealth: Payer: Self-pay | Admitting: Family Medicine

## 2023-04-16 NOTE — Telephone Encounter (Signed)
Pt called. She would like to switch to [facility] because her family are patients of [facility].

## 2023-04-16 NOTE — Telephone Encounter (Signed)
Ok with me 

## 2023-04-17 ENCOUNTER — Other Ambulatory Visit: Payer: Self-pay | Admitting: Family Medicine

## 2023-04-17 DIAGNOSIS — F411 Generalized anxiety disorder: Secondary | ICD-10-CM

## 2023-04-20 ENCOUNTER — Other Ambulatory Visit: Payer: Self-pay | Admitting: Family Medicine

## 2023-04-20 ENCOUNTER — Encounter: Payer: Self-pay | Admitting: Physician Assistant

## 2023-04-20 DIAGNOSIS — F411 Generalized anxiety disorder: Secondary | ICD-10-CM

## 2023-04-25 ENCOUNTER — Ambulatory Visit (INDEPENDENT_AMBULATORY_CARE_PROVIDER_SITE_OTHER): Payer: BC Managed Care – PPO | Admitting: Physician Assistant

## 2023-04-25 ENCOUNTER — Encounter: Payer: Self-pay | Admitting: Physician Assistant

## 2023-04-25 VITALS — BP 116/73 | HR 67 | Ht 67.0 in | Wt 188.0 lb

## 2023-04-25 DIAGNOSIS — Z131 Encounter for screening for diabetes mellitus: Secondary | ICD-10-CM | POA: Diagnosis not present

## 2023-04-25 DIAGNOSIS — Z13220 Encounter for screening for lipoid disorders: Secondary | ICD-10-CM

## 2023-04-25 DIAGNOSIS — Z0000 Encounter for general adult medical examination without abnormal findings: Secondary | ICD-10-CM | POA: Diagnosis not present

## 2023-04-25 DIAGNOSIS — F411 Generalized anxiety disorder: Secondary | ICD-10-CM

## 2023-04-25 DIAGNOSIS — Z3041 Encounter for surveillance of contraceptive pills: Secondary | ICD-10-CM

## 2023-04-25 MED ORDER — SERTRALINE HCL 50 MG PO TABS
75.0000 mg | ORAL_TABLET | Freq: Every day | ORAL | 3 refills | Status: DC
Start: 2023-04-25 — End: 2024-03-05

## 2023-04-25 MED ORDER — NORETHIN ACE-ETH ESTRAD-FE 1.5-30 MG-MCG PO TABS: 1.0000 | ORAL_TABLET | Freq: Every day | ORAL | 3 refills | Status: DC

## 2023-04-25 NOTE — Patient Instructions (Signed)

## 2023-04-25 NOTE — Progress Notes (Signed)
Complete physical exam  Patient: Amanda Ferrell   DOB: 1976/04/09   47 y.o. Female  MRN: [id number]  Subjective:    Chief Complaint  Patient presents with   Annual Exam    Amanda Ferrell is a 47 y.o. female who presents today for a complete physical exam. She reports consuming a general diet but agrees she needs to increase her intake of fruits and vegetables. She drinks 5-6 bottles of water/day.  She walks for at least 3-4x/wk. She generally feels well and reports no new stressors. She reports sleeping well 7-8hrs/night. She does not have additional problems to discuss today.    Most recent fall risk assessment:    04/25/2023    3:35 PM  Fall Risk   Falls in the past year? 1  Number falls in past yr: 1  Injury with Fall? 0  Risk for fall due to : History of fall(s)  Follow up Falls evaluation completed     Most recent depression screenings:    04/25/2023    3:35 PM 02/01/2023    3:42 PM  PHQ 2/9 Scores  PHQ - 2 Score 0 2  PHQ- 9 Score  5    Vision:Within last year and Dental: No current dental problems and Receives regular dental care  Patient Active Problem List   Diagnosis Date Noted   Heartburn 02/01/2023   GAD (generalized anxiety disorder) 01/11/2023   Hyperlipidemia 02/10/2020   DERMATOPHYTOSIS OF NAIL 04/03/2008   Past Medical History:  Diagnosis Date   Anxiety    Benign breast cyst in female    Depression    Family History  Problem Relation Age of Onset   Heart attack Maternal Grandmother    Heart attack Paternal Aunt        smoker   Heart attack Paternal Grandfather        smoker   Allergies  Allergen Reactions   Penicillins     Mom told her not to take it.       Patient Care Team: Nolene Ebbs as PCP - General (Family Medicine)   Outpatient Medications Prior to Visit  Medication Sig   Ascorbic Acid (SUPER C COMPLEX PO) Take by mouth.   calcium carbonate (OSCAL) 1500 (600 Ca) MG TABS tablet Take by mouth 2 (two) times  daily with a meal.   Multiple Vitamin (MULTIVITAMIN) tablet Take 1 tablet by mouth daily.   pantoprazole (PROTONIX) 40 MG tablet TAKE 1 TABLET BY MOUTH TWICE DAILY   [DISCONTINUED] LARIN FE 1.5/30 1.5-30 MG-MCG tablet Take 1 tablet by mouth once daily   [DISCONTINUED] sertraline (ZOLOFT) 50 MG tablet Take 1.5 tablets (75 mg total) by mouth daily.   [DISCONTINUED] celecoxib (CELEBREX) 200 MG capsule Take 1 capsule (200 mg total) by mouth 2 (two) times daily.   No facility-administered medications prior to visit.    Review of Systems  HENT:         Muffled hearing  Eyes:  Negative for blurred vision and double vision.  Respiratory:  Negative for shortness of breath.   Cardiovascular:  Positive for leg swelling. Negative for chest pain and palpitations.  Gastrointestinal:  Negative for constipation and diarrhea.  Skin:  Negative for rash.  Neurological:  Negative for dizziness and headaches.  Psychiatric/Behavioral:  Negative for depression. The patient is not nervous/anxious.           Objective:     BP 116/73 (BP Location: Left Arm, Patient Position: Sitting, Cuff  Size: Normal)   Pulse 67   Ht 5\' 7"  (1.702 m)   Wt 188 lb (85.3 kg)   SpO2 100%   BMI 29.44 kg/m  BP Readings from Last 3 Encounters:  04/25/23 116/73  04/03/23 106/63  02/22/23 120/69   Wt Readings from Last 3 Encounters:  04/25/23 188 lb (85.3 kg)  02/22/23 183 lb (83 kg)  02/01/23 183 lb (83 kg)      Physical Exam HENT:     Head: Normocephalic and atraumatic.     Right Ear: External ear normal. There is impacted cerumen.     Left Ear: External ear normal. There is impacted cerumen.     Nose: Nose normal. No congestion or rhinorrhea.     Mouth/Throat:     Mouth: Mucous membranes are moist.     Pharynx: Oropharynx is clear. No oropharyngeal exudate or posterior oropharyngeal erythema.  Eyes:     Extraocular Movements: Extraocular movements intact.     Conjunctiva/sclera: Conjunctivae normal.      Pupils: Pupils are equal, round, and reactive to light.  Cardiovascular:     Rate and Rhythm: Normal rate and regular rhythm.     Pulses: Normal pulses.     Heart sounds: Normal heart sounds.  Pulmonary:     Effort: Pulmonary effort is normal.     Breath sounds: Normal breath sounds.  Abdominal:     General: Abdomen is flat. Bowel sounds are normal.     Palpations: Abdomen is soft. There is no mass.     Tenderness: There is no abdominal tenderness.  Musculoskeletal:        General: No deformity or signs of injury.     Cervical back: Normal range of motion and neck supple. No tenderness.     Right lower leg: No edema.     Left lower leg: No edema.  Lymphadenopathy:     Cervical: No cervical adenopathy.  Neurological:     General: No focal deficit present.     Mental Status: She is alert and oriented to person, place, and time.     Cranial Nerves: No cranial nerve deficit.     Deep Tendon Reflexes: Reflexes normal.  Psychiatric:        Mood and Affect: Mood normal.        Behavior: Behavior normal.        Thought Content: Thought content normal.        Judgment: Judgment normal.         Assessment & Plan:    Routine Health Maintenance and Physical Exam  Immunization History  Administered Date(s) Administered   Tdap 06/14/2011    Health Maintenance  Topic Date Due   Hepatitis C Screening  02/22/2024 (Originally 11/04/1995)   HIV Screening  02/22/2024 (Originally 11/03/1992)   COVID-19 Vaccine (1) 03/09/2025 (Originally 05/04/1978)   INFLUENZA VACCINE  06/07/2023   Fecal DNA (Cologuard)  11/18/2025   PAP SMEAR-Modifier  03/17/2026   HPV VACCINES  Aged Out   DTaP/Tdap/Td  Discontinued    Discussed health benefits of physical activity, and encouraged her to engage in regular exercise appropriate for her age and condition. [name] was seen today for annual exam.  Diagnoses and all orders for this visit:  Routine physical examination -     TSH -     Lipid Panel  w/reflex Direct LDL -     COMPLETE METABOLIC PANEL WITH GFR -     CBC with Differential/Platelet -  VITAMIN D 25 Hydroxy (Vit-D Deficiency, Fractures)  GAD (generalized anxiety disorder) -     sertraline (ZOLOFT) 50 MG tablet; Take 1.5 tablets (75 mg total) by mouth daily.  Screening for diabetes mellitus -     COMPLETE METABOLIC PANEL WITH GFR  Screening for lipid disorders -     Lipid Panel w/reflex Direct LDL  Encounter for surveillance of contraceptive pills -     norethindrone-ethinyl estradiol-iron (LARIN FE 1.5/30) 1.5-30 MG-MCG tablet; Take 1 tablet by mouth daily.    Patient to continue all medications as prescribed Discussed increasing servings of fruits/vegetables Patient declined Tdap. Mammogram, pap, and cologuard UTD  Return in about 1 year (around 04/24/2024), or if symptoms worsen or fail to improve.     Tandy Gaw, PA-C

## 2023-04-27 ENCOUNTER — Encounter: Payer: Self-pay | Admitting: Physician Assistant

## 2023-05-14 ENCOUNTER — Other Ambulatory Visit: Payer: Self-pay | Admitting: Physician Assistant

## 2023-05-14 DIAGNOSIS — Z1231 Encounter for screening mammogram for malignant neoplasm of breast: Secondary | ICD-10-CM

## 2023-05-16 ENCOUNTER — Ambulatory Visit: Payer: BC Managed Care – PPO

## 2023-05-16 DIAGNOSIS — Z1231 Encounter for screening mammogram for malignant neoplasm of breast: Secondary | ICD-10-CM | POA: Diagnosis not present

## 2023-05-17 NOTE — Progress Notes (Signed)
Normal mammogram. Follow up in 1 year.

## 2023-05-18 LAB — CBC WITH DIFFERENTIAL/PLATELET
Absolute Monocytes: 427 cells/uL (ref 200–950)
Basophils Relative: 1.4 %
Eosinophils Absolute: 101 cells/uL (ref 15–500)
MCH: 31.2 pg (ref 27.0–33.0)
MCHC: 32.8 g/dL (ref 32.0–36.0)
MCV: 95 fL (ref 80.0–100.0)
MPV: 10.7 fL (ref 7.5–12.5)
Monocytes Relative: 9.7 %
Neutrophils Relative %: 50.4 %
Platelets: 262 10*3/uL (ref 140–400)
RBC: 3.98 10*6/uL (ref 3.80–5.10)

## 2023-05-19 LAB — COMPLETE METABOLIC PANEL WITH GFR
AG Ratio: 1.6 (calc) (ref 1.0–2.5)
ALT: 14 U/L (ref 6–29)
AST: 17 U/L (ref 10–35)
Albumin: 4 g/dL (ref 3.6–5.1)
Alkaline phosphatase (APISO): 51 U/L (ref 31–125)
BUN: 17 mg/dL (ref 7–25)
CO2: 28 mmol/L (ref 20–32)
Calcium: 9.1 mg/dL (ref 8.6–10.2)
Chloride: 103 mmol/L (ref 98–110)
Creat: 0.75 mg/dL (ref 0.50–0.99)
Globulin: 2.5 g/dL (calc) (ref 1.9–3.7)
Glucose, Bld: 92 mg/dL (ref 65–99)
Potassium: 4.4 mmol/L (ref 3.5–5.3)
Sodium: 137 mmol/L (ref 135–146)
Total Bilirubin: 0.4 mg/dL (ref 0.2–1.2)
Total Protein: 6.5 g/dL (ref 6.1–8.1)
eGFR: 100 mL/min/{1.73_m2} (ref >=60)

## 2023-05-19 LAB — CBC WITH DIFFERENTIAL/PLATELET
Basophils Absolute: 62 cells/uL (ref 0–200)
Eosinophils Relative: 2.3 %
HCT: 37.8 % (ref 35.0–45.0)
Hemoglobin: 12.4 g/dL (ref 11.7–15.5)
Lymphs Abs: 1593 cells/uL (ref 850–3900)
Neutro Abs: 2218 cells/uL (ref 1500–7800)
RDW: 11.9 % (ref 11.0–15.0)
Total Lymphocyte: 36.2 %
WBC: 4.4 10*3/uL (ref 3.8–10.8)

## 2023-05-19 LAB — LIPID PANEL W/REFLEX DIRECT LDL
Cholesterol: 185 mg/dL (ref <200)
HDL: 72 mg/dL (ref >=50)
LDL Cholesterol (Calc): 100 mg/dL (calc) — ABNORMAL HIGH
Non-HDL Cholesterol (Calc): 113 mg/dL (calc) (ref <130)
Total CHOL/HDL Ratio: 2.6 (calc) (ref <5.0)
Triglycerides: 52 mg/dL (ref <150)

## 2023-05-19 LAB — VITAMIN D 25 HYDROXY (VIT D DEFICIENCY, FRACTURES): Vit D, 25-Hydroxy: 49 ng/mL (ref 30–100)

## 2023-05-19 LAB — TSH: TSH: 2.26 mIU/L

## 2023-05-21 NOTE — Progress Notes (Signed)
Thyroid looks good.   Kidney, liver, glucose looks good.   Vitamin D looks great.   Cholesterol looks good! 10 year risk is really low.   Marland Kitchen.The 10-year ASCVD risk score (Arnett DK, et al., 2019) is: 0.4%   Values used to calculate the score:     Age: 48 years     Sex: Female     Is Non-Hispanic African American: No     Diabetic: No     Tobacco smoker: No     Systolic Blood Pressure: 116 mmHg     Is BP treated: No     HDL Cholesterol: 72 mg/dL     Total Cholesterol: 185 mg/dL

## 2023-05-29 ENCOUNTER — Telehealth: Payer: Self-pay

## 2023-05-29 NOTE — Transitions of Care (Post Inpatient/ED Visit) (Signed)
   05/29/2023  Name: Amanda Ferrell MRN: [id number] DOB: 1975/11/25  Today's TOC FU Call Status: Today's TOC FU Call Status:: Successful TOC FU Call Competed TOC FU Call Complete Date: 05/29/23  Transition Care Management Follow-up Telephone Call Date of Discharge: 05/28/23 Discharge Facility: Other ([facility]) Name of Other ([facility]) Discharge Facility: [facility] Type of Discharge: Inpatient Admission Primary Inpatient Discharge Diagnosis:: laceration How have you been since you were released from the hospital?: Better Any questions or concerns?: No  Items Reviewed: Did you receive and understand the discharge instructions provided?: Yes Medications obtained,verified, and reconciled?: Yes (Medications Reviewed) Any new allergies since your discharge?: No Dietary orders reviewed?: Yes Do you have support at home?: Yes People in Home: spouse  Medications Reviewed Today: Medications Reviewed Today     Reviewed by Amanda Addison, LPN (Licensed Practical Nurse) on 05/29/23 at 0930  Med List Status: <None>   Medication Order Taking? Sig Documenting Provider Last Dose Status Informant  Ascorbic Acid (SUPER C COMPLEX PO) [id number] No Take by mouth. [provider] Taking Active   calcium carbonate (OSCAL) 1500 (600 Ca) MG TABS tablet [id number] No Take by mouth 2 (two) times daily with a meal. [provider] Taking Active   Multiple Vitamin (MULTIVITAMIN) tablet [id number] No Take 1 tablet by mouth daily. [provider] Taking Active   norethindrone-ethinyl estradiol-iron Amanda Ferrell FE 1.5/30) 1.5-30 MG-MCG tablet [id number]  Take 1 tablet by mouth daily. Jomarie Longs, PA-C  Active   pantoprazole (PROTONIX) 40 MG tablet [id number] No TAKE 1 TABLET BY MOUTH TWICE DAILY Agapito Games, MD Taking Active   sertraline (ZOLOFT) 50 MG tablet [id number]  Take 1.5 tablets (75 mg total) by mouth daily. Jomarie Longs, PA-C  Active              Home Care and Equipment/Supplies: Were Home Health Services Ordered?: NA Any new equipment or medical supplies ordered?: NA  Functional Questionnaire: Do you need assistance with bathing/showering or dressing?: No Do you need assistance with meal preparation?: No Do you need assistance with eating?: No Do you have difficulty maintaining continence: No Do you need assistance with getting out of bed/getting out of a chair/moving?: No Do you have difficulty managing or taking your medications?: No  Follow up appointments reviewed: PCP Follow-up appointment confirmed?: NA Specialist Hospital Follow-up appointment confirmed?: Yes Date of Specialist follow-up appointment?: 06/01/23 Follow-Up Specialty Provider:: surgeon Do you need transportation to your follow-up appointment?: No Do you understand care options if your condition(s) worsen?: Yes-patient verbalized understanding    SIGNATURE Amanda Addison, LPN [facility] Nurse Health Advisor Direct Dial [phone]

## 2023-06-04 ENCOUNTER — Ambulatory Visit: Payer: BC Managed Care – PPO | Admitting: Physician Assistant

## 2023-06-04 ENCOUNTER — Encounter: Payer: Self-pay | Admitting: Physician Assistant

## 2023-06-04 VITALS — BP 122/83 | HR 82 | Ht 67.0 in | Wt 189.0 lb

## 2023-06-04 DIAGNOSIS — S022XXB Fracture of nasal bones, initial encounter for open fracture: Secondary | ICD-10-CM | POA: Insufficient documentation

## 2023-06-04 DIAGNOSIS — S0285XA Fracture of orbit, unspecified, initial encounter for closed fracture: Secondary | ICD-10-CM | POA: Insufficient documentation

## 2023-06-04 DIAGNOSIS — H5712 Ocular pain, left eye: Secondary | ICD-10-CM

## 2023-06-04 DIAGNOSIS — S0181XD Laceration without foreign body of other part of head, subsequent encounter: Secondary | ICD-10-CM | POA: Diagnosis not present

## 2023-06-04 DIAGNOSIS — W19XXXA Unspecified fall, initial encounter: Secondary | ICD-10-CM | POA: Insufficient documentation

## 2023-06-04 DIAGNOSIS — S0121XA Laceration without foreign body of nose, initial encounter: Secondary | ICD-10-CM | POA: Insufficient documentation

## 2023-06-04 DIAGNOSIS — S0285XD Fracture of orbit, unspecified, subsequent encounter for fracture with routine healing: Secondary | ICD-10-CM

## 2023-06-04 DIAGNOSIS — Z09 Encounter for follow-up examination after completed treatment for conditions other than malignant neoplasm: Secondary | ICD-10-CM

## 2023-06-04 DIAGNOSIS — S0121XD Laceration without foreign body of nose, subsequent encounter: Secondary | ICD-10-CM | POA: Diagnosis not present

## 2023-06-04 DIAGNOSIS — S022XXD Fracture of nasal bones, subsequent encounter for fracture with routine healing: Secondary | ICD-10-CM

## 2023-06-04 DIAGNOSIS — S0181XA Laceration without foreign body of other part of head, initial encounter: Secondary | ICD-10-CM | POA: Insufficient documentation

## 2023-06-04 DIAGNOSIS — W19XXXD Unspecified fall, subsequent encounter: Secondary | ICD-10-CM

## 2023-06-04 NOTE — Progress Notes (Signed)
Established Patient Office Visit  Subjective   Patient ID: Amanda Ferrell, female    DOB: Feb 07, 1976  Age: 47 y.o. MRN: [id number]  Chief Complaint  Patient presents with   Hospitalization Follow-up    HPI Pt is a 47 yo female who presents to the clinic to follow up after hospital visit after fall in the pool on 05/27/2023 that led to open nasal fracture and laceration of nose and forehead. She was at the beach and went to [facility]. All she remembers is slipping and leaning forward and falling on her face. Her husband is with her today. She did have reduction to nasal fracture on 7/22. Repair spaned 3cm nose and 15cm forehead. She followed up with PA on 7/26 and was doing well. They did request her to get referral to maxillofacial surgeon and eye exam.  Pt denies any vision changes but states it is still painful to move her left eye. She is cleaning the wound twice a day and placing bactroban on it. No purulent discharge or erythema. She is controlling her pain with tylenol and ice.    .. Active Ambulatory Problems    Diagnosis Date Noted   DERMATOPHYTOSIS OF NAIL 04/03/2008   Hyperlipidemia 02/10/2020   GAD (generalized anxiety disorder) 01/11/2023   Heartburn 02/01/2023   Open fracture of nasal bones 06/04/2023   Fall 06/04/2023   Laceration of forehead 06/04/2023   Laceration of nose 06/04/2023   Acute left eye pain 06/04/2023   Closed fracture of left orbit (HCC) 06/04/2023   Resolved Ambulatory Problems    Diagnosis Date Noted   DEPRESSION 03/12/2008   Cystitis 09/19/2013   Nondisplaced fracture of first metatarsal bone, right foot, subsequent encounter for fracture with routine healing 06/13/2021   Past Medical History:  Diagnosis Date   Anxiety    Benign breast cyst in female    Depression      ROS See HPI.    Objective:     BP 122/83   Pulse 82   Ht 5\' 7"  (1.702 m)   Wt 189 lb (85.7 kg)   LMP 05/10/2023   SpO2 99%   BMI 29.60 kg/m  BP  Readings from Last 3 Encounters:  06/04/23 122/83  04/25/23 116/73  04/03/23 106/63   Wt Readings from Last 3 Encounters:  06/04/23 189 lb (85.7 kg)  04/25/23 188 lb (85.3 kg)  02/22/23 183 lb (83 kg)      Physical Exam HENT:     Head:     Comments: Traumatic head injury with bruising around left eye and swelling.  3cm nasal laceration with 15cm forehead laceration with minimal erythema and purulent drainage.  Cardiovascular:     Rate and Rhythm: Normal rate.  Pulmonary:     Effort: Pulmonary effort is normal.  Neurological:     General: No focal deficit present.     Mental Status: She is alert.  Psychiatric:        Mood and Affect: Mood normal.        Assessment & Plan:  Marland KitchenMarland KitchenAmie was seen today for hospitalization follow-up.  Diagnoses and all orders for this visit:  Hospital discharge follow-up  Open fracture of nasal bone with routine healing, subsequent encounter -     Ambulatory referral to Oral Maxillofacial Surgery  Laceration of nose, subsequent encounter -     Ambulatory referral to Oral Maxillofacial Surgery  Laceration of forehead, subsequent encounter -     Ambulatory referral to Oral Maxillofacial  Surgery  Fall, subsequent encounter -     Ambulatory referral to Oral Maxillofacial Surgery  Acute left eye pain -     Ambulatory referral to Oral Maxillofacial Surgery -     Ambulatory referral to Ophthalmology  Closed fracture of left orbit with routine healing, subsequent encounter -     Ambulatory referral to Oral Maxillofacial Surgery -     Ambulatory referral to Ophthalmology   Referrals made for eye doctor and maxillofacial surgeon Wound is healing well, continue to watch for signs of infection and clean twice a day Continue tylenol and ice for pain relief  Follow up as needed   No follow-ups on file.    Tandy Gaw, PA-C

## 2023-06-04 NOTE — Patient Instructions (Addendum)
Will make 2 referrals for surgeon and eye doctor.   Dr. Riki Rusk, DDS - [facility]  Dr. Riki Rusk, DDS is an [facility] Surgeons [facility] Maxillofacial Surgeon in [place], [place] ... They are affiliated with [facility] and [facility] ... 4.6

## 2023-06-06 ENCOUNTER — Telehealth: Payer: Self-pay | Admitting: Physician Assistant

## 2023-06-06 ENCOUNTER — Encounter: Payer: Self-pay | Admitting: Physician Assistant

## 2023-06-06 NOTE — Telephone Encounter (Signed)
Patient called in wanting an update on her referrals. Please Advise.

## 2023-06-06 NOTE — Telephone Encounter (Signed)
Yes change to urgent!

## 2023-06-06 NOTE — Telephone Encounter (Signed)
Referral updated to urgent.  Placed call to [facility]. Scheduled patient for 06/07/2023 at [facility] at [place] office.  Called patient and made them aware of their appointment date, time, location, and necessary items. Patient expressed understanding and inquired about facial surgery referral. Informed patient that while referral coordinator is absent, I am able to do stat requests but that I will make every effort to have them sent ASAP.   Faxed urgent referral including insurance, notes, and referral information to number provided by [facility].  Katha Hamming

## 2023-06-11 ENCOUNTER — Encounter: Payer: Self-pay | Admitting: Physician Assistant

## 2023-06-28 NOTE — Telephone Encounter (Signed)
Referral, clinical notes and copies of insurance cards have been faxed to [facility] at [phone].    Pt has an appointment scheduled for 06/14/2023 at 9:45am with Dr. Marius Ditch Arville Lime

## 2023-09-27 ENCOUNTER — Encounter: Payer: Self-pay | Admitting: Physician Assistant

## 2023-11-05 ENCOUNTER — Encounter: Payer: Self-pay | Admitting: Physician Assistant

## 2023-12-27 ENCOUNTER — Ambulatory Visit: Payer: BC Managed Care – PPO | Admitting: Medical-Surgical

## 2023-12-28 ENCOUNTER — Encounter: Payer: Self-pay | Admitting: Physician Assistant

## 2023-12-28 ENCOUNTER — Ambulatory Visit (INDEPENDENT_AMBULATORY_CARE_PROVIDER_SITE_OTHER): Payer: BC Managed Care – PPO | Admitting: Physician Assistant

## 2023-12-28 VITALS — BP 100/66 | HR 74 | Temp 99.7°F | Ht 67.0 in | Wt 200.5 lb

## 2023-12-28 DIAGNOSIS — R051 Acute cough: Secondary | ICD-10-CM | POA: Diagnosis not present

## 2023-12-28 DIAGNOSIS — R6889 Other general symptoms and signs: Secondary | ICD-10-CM

## 2023-12-28 DIAGNOSIS — Z20828 Contact with and (suspected) exposure to other viral communicable diseases: Secondary | ICD-10-CM

## 2023-12-28 MED ORDER — HYDROCODONE BIT-HOMATROP MBR 5-1.5 MG/5ML PO SOLN
5.0000 mL | Freq: Three times a day (TID) | ORAL | 0 refills | Status: DC | PRN
Start: 2023-12-28 — End: 2024-05-19

## 2023-12-28 MED ORDER — OSELTAMIVIR PHOSPHATE 75 MG PO CAPS
75.0000 mg | ORAL_CAPSULE | Freq: Two times a day (BID) | ORAL | 0 refills | Status: DC
Start: 2023-12-28 — End: 2024-05-19

## 2023-12-28 NOTE — Patient Instructions (Signed)

## 2023-12-28 NOTE — Progress Notes (Signed)
Acute Office Visit  Subjective:     Patient ID: Amanda Ferrell, female    DOB: July 30, 1976, 48 y.o.   MRN: [id number]  Chief Complaint  Patient presents with   Headache    Chills x5days    HPI Patient is in today for flu like symptoms since Sunday. Her daughter has been dx with flu A. Pt's symptoms worsened on Wednesday. She has fever, body aches, ST, headache, fatigue and productive cough. Her cough does keep her up at night. She denies any SOB. She is taking OTC tylenol cold and flu and delsym.   Pt not had covid or flu vaccine.   .. Active Ambulatory Problems    Diagnosis Date Noted   DERMATOPHYTOSIS OF NAIL 04/03/2008   Hyperlipidemia 02/10/2020   GAD (generalized anxiety disorder) 01/11/2023   Heartburn 02/01/2023   Open fracture of nasal bones 06/04/2023   Fall 06/04/2023   Laceration of forehead 06/04/2023   Laceration of nose 06/04/2023   Acute left eye pain 06/04/2023   Closed fracture of left orbit (HCC) 06/04/2023   Resolved Ambulatory Problems    Diagnosis Date Noted   DEPRESSION 03/12/2008   Cystitis 09/19/2013   Nondisplaced fracture of first metatarsal bone, right foot, subsequent encounter for fracture with routine healing 06/13/2021   Past Medical History:  Diagnosis Date   Anxiety    Benign breast cyst in female    Depression      ROS  See HPI.     Objective:    BP 100/66 (BP Location: Left Arm, Patient Position: Sitting, Cuff Size: Large)   Pulse 74   Temp 99.7 F (37.6 C) (Oral)   Ht 5\' 7"  (1.702 m)   Wt 200 lb 8 oz (90.9 kg)   SpO2 100%   BMI 31.40 kg/m  BP Readings from Last 3 Encounters:  12/28/23 100/66  06/04/23 122/83  04/25/23 116/73   Wt Readings from Last 3 Encounters:  12/28/23 200 lb 8 oz (90.9 kg)  06/04/23 189 lb (85.7 kg)  04/25/23 188 lb (85.3 kg)      Physical Exam Constitutional:      Appearance: She is ill-appearing.  HENT:     Head: Normocephalic.     Right Ear: Tympanic membrane, ear canal and  external ear normal. There is no impacted cerumen.     Left Ear: Tympanic membrane, ear canal and external ear normal. There is no impacted cerumen.     Nose: Rhinorrhea present.     Mouth/Throat:     Mouth: Mucous membranes are moist.     Pharynx: Posterior oropharyngeal erythema present. No oropharyngeal exudate.  Eyes:     General:        Right eye: No discharge.        Left eye: No discharge.     Conjunctiva/sclera: Conjunctivae normal.  Cardiovascular:     Rate and Rhythm: Normal rate.  Pulmonary:     Effort: Pulmonary effort is normal.     Breath sounds: Normal breath sounds.  Musculoskeletal:     Cervical back: Normal range of motion and neck supple.     Right lower leg: No edema.     Left lower leg: No edema.  Lymphadenopathy:     Cervical: Cervical adenopathy present.  Neurological:     General: No focal deficit present.     Mental Status: She is alert and oriented to person, place, and time.  Psychiatric:        Mood and Affect:  Mood normal.          Assessment & Plan:  Marland KitchenMarland KitchenAmie was seen today for headache.  Diagnoses and all orders for this visit:  Flu-like symptoms -     HYDROcodone bit-homatropine (HYCODAN) 5-1.5 MG/5ML syrup; Take 5 mLs by mouth every 8 (eight) hours as needed. -     oseltamivir (TAMIFLU) 75 MG capsule; Take 1 capsule (75 mg total) by mouth 2 (two) times daily.  Exposure to the flu  Acute cough -     HYDROcodone bit-homatropine (HYCODAN) 5-1.5 MG/5ML syrup; Take 5 mLs by mouth every 8 (eight) hours as needed.   Tamiflu to start, she may be out of window and patient warned it might not help as much.  Continue tylenol cold and sinus Hycodan for cough Rest and hydrate Discussed symptom timeline HO given Follow up as needed or if symptoms worsen or not improve Written out of work through rest of week  [employer], PA-C

## 2024-03-04 ENCOUNTER — Other Ambulatory Visit: Payer: Self-pay | Admitting: Family Medicine

## 2024-03-04 DIAGNOSIS — R12 Heartburn: Secondary | ICD-10-CM

## 2024-03-05 ENCOUNTER — Encounter: Payer: Self-pay | Admitting: Physician Assistant

## 2024-03-05 DIAGNOSIS — F411 Generalized anxiety disorder: Secondary | ICD-10-CM

## 2024-03-05 MED ORDER — SERTRALINE HCL 50 MG PO TABS: 75.0000 mg | ORAL_TABLET | Freq: Every day | ORAL | 0 refills | Status: DC

## 2024-04-08 ENCOUNTER — Other Ambulatory Visit: Payer: Self-pay | Admitting: Physician Assistant

## 2024-04-08 DIAGNOSIS — Z1231 Encounter for screening mammogram for malignant neoplasm of breast: Secondary | ICD-10-CM

## 2024-05-19 ENCOUNTER — Ambulatory Visit (INDEPENDENT_AMBULATORY_CARE_PROVIDER_SITE_OTHER): Admitting: Physician Assistant

## 2024-05-19 ENCOUNTER — Encounter: Payer: Self-pay | Admitting: Physician Assistant

## 2024-05-19 VITALS — BP 135/75 | HR 75 | Ht 67.0 in | Wt 205.0 lb

## 2024-05-19 DIAGNOSIS — Z3041 Encounter for surveillance of contraceptive pills: Secondary | ICD-10-CM | POA: Diagnosis not present

## 2024-05-19 DIAGNOSIS — E782 Mixed hyperlipidemia: Secondary | ICD-10-CM

## 2024-05-19 DIAGNOSIS — Z131 Encounter for screening for diabetes mellitus: Secondary | ICD-10-CM

## 2024-05-19 DIAGNOSIS — R12 Heartburn: Secondary | ICD-10-CM

## 2024-05-19 DIAGNOSIS — F411 Generalized anxiety disorder: Secondary | ICD-10-CM | POA: Diagnosis not present

## 2024-05-19 DIAGNOSIS — Z0000 Encounter for general adult medical examination without abnormal findings: Secondary | ICD-10-CM

## 2024-05-19 DIAGNOSIS — H6123 Impacted cerumen, bilateral: Secondary | ICD-10-CM | POA: Insufficient documentation

## 2024-05-19 MED ORDER — PANTOPRAZOLE SODIUM 40 MG PO TBEC: 40.0000 mg | DELAYED_RELEASE_TABLET | Freq: Two times a day (BID) | ORAL | 3 refills | Status: AC

## 2024-05-19 MED ORDER — SERTRALINE HCL 50 MG PO TABS: 75.0000 mg | ORAL_TABLET | Freq: Every day | ORAL | 3 refills | Status: AC

## 2024-05-19 MED ORDER — NORETHIN ACE-ETH ESTRAD-FE 1.5-30 MG-MCG PO TABS: 1.0000 | ORAL_TABLET | Freq: Every day | ORAL | 3 refills | Status: AC

## 2024-05-19 NOTE — Patient Instructions (Signed)

## 2024-05-19 NOTE — Progress Notes (Signed)
 Established Patient Office Visit  Subjective   Patient ID: Amanda Ferrell, female    DOB: 21-Jun-1976  Age: 48 y.o. MRN: [id number]  Chief Complaint  Patient presents with   Annual Exam   Shamari Trostel presents today for a routine wellness check and to get refills for her medications. She is feeling a bit congested today due to allergies, but states she has been taking allergy medicine which is helping. She also states her left ear is 'stopped up' today. Patient states she has been taking her Zoloft  regularly and states her depression symptoms have been well managed, however she states she has been noticing some difficulty with maintaining her concentration at work, but this has not been affecting her performance.  Review of Systems  Constitutional:  Negative for malaise/fatigue.  HENT:  Negative for congestion and ear pain.   Cardiovascular:  Negative for chest pain.  Gastrointestinal:  Negative for constipation and diarrhea.  All other systems reviewed and are negative.     Objective:     BP 135/75   Pulse 75   Ht 5' 7 (1.702 m)   Wt 93 kg   SpO2 99%   BMI 32.11 kg/m  BP Readings from Last 3 Encounters:  05/19/24 135/75  12/28/23 100/66  06/04/23 122/83      Physical Exam Vitals reviewed.  Constitutional:      Appearance: Normal appearance. She is normal weight.  HENT:     Head: Normocephalic and atraumatic.     Right Ear: Tympanic membrane normal.     Left Ear: There is impacted cerumen.     Ears:     Comments: Left ear canal occluded with wax. Right ear canal also partially occluded.  Cardiovascular:     Rate and Rhythm: Normal rate and regular rhythm.     Pulses: Normal pulses.     Heart sounds: Normal heart sounds.  Pulmonary:     Effort: Pulmonary effort is normal.     Breath sounds: Normal breath sounds.  Musculoskeletal:        General: Normal range of motion.  Skin:    General: Skin is warm and dry.  Neurological:     Mental Status: She is  alert.      No results found for any visits on 05/19/24.  Last CBC Lab Results  Component Value Date   WBC 4.4 05/18/2023   HGB 12.4 05/18/2023   HCT 37.8 05/18/2023   MCV 95.0 05/18/2023   MCH 31.2 05/18/2023   RDW 11.9 05/18/2023   PLT 262 05/18/2023   Last metabolic panel Lab Results  Component Value Date   GLUCOSE 92 05/18/2023   NA 137 05/18/2023   K 4.4 05/18/2023   CL 103 05/18/2023   CO2 28 05/18/2023   BUN 17 05/18/2023   CREATININE 0.75 05/18/2023   EGFR 100 05/18/2023   CALCIUM 9.1 05/18/2023   PROT 6.5 05/18/2023   ALBUMIN 3.5 (L) 08/19/2015   BILITOT 0.4 05/18/2023   ALKPHOS 67 03/14/2017   AST 17 05/18/2023   ALT 14 05/18/2023   Last lipids Lab Results  Component Value Date   CHOL 185 05/18/2023   HDL 72 05/18/2023   LDLCALC 100 (H) 05/18/2023   TRIG 52 05/18/2023   CHOLHDL 2.6 05/18/2023   Last hemoglobin A1c Lab Results  Component Value Date   HGBA1C 5 03/14/2017   Last thyroid functions Lab Results  Component Value Date   TSH 2.26 05/18/2023   Last vitamin D   Lab Results  Component Value Date   VD25OH 49 05/18/2023      The 10-year ASCVD risk score (Arnett DK, et al., 2019) is: 0.6%    Assessment & Plan:   Problem List Items Addressed This Visit       Other   Hyperlipidemia   Relevant Orders   Lipid panel   GAD (generalized anxiety disorder)   Relevant Medications   sertraline  (ZOLOFT ) 50 MG tablet   Heartburn   Relevant Medications   pantoprazole  (PROTONIX ) 40 MG tablet   Other Visit Diagnoses       Routine physical examination    -  Primary   Relevant Orders   CBC with Differential/Platelet   CMP14+EGFR   Lipid panel   TSH   VITAMIN D  25 Hydroxy (Vit-D Deficiency, Fractures)     Encounter for surveillance of contraceptive pills       Relevant Medications   norethindrone-ethinyl estradiol-iron (LARIN FE 1.5/30) 1.5-30 MG-MCG tablet     Screening for diabetes mellitus       Relevant Orders   CMP14+EGFR       Generalized Anxiety Disorder - Continue Sertraline  daily, refill sent into pharmacy  - Discussed symptoms of decreased concentration and inattention. Recommended patient tries Ashwaghanda to help with anxiety/ concentration symptoms for 4-6 weeks; if no change in sxs, can look into trying additional medications like Wellbutrin.   Hyperlipidemia - Previous LDL: 100 (05/18/2023 - Lipid panel ordered today  GERD - Continue Protonix , refills sent into pharmacy   Oral Contraceptive surveillance - Continue OCPs, refills sent into pharmacy   Impacted cerumen, Left ear  Partially impacted cerumen, right ear - Bilateral ear irrigation and wax removal completed today   Seasonal allergies - Continue OTC allergy medication as needed for symptom relief  Health Maintenance/ Routine Physical Screening for DM - Ordered CBC, CMP, TSH, Vit D, Lipid Panel    Return in about 1 year (around 05/19/2025).    Tinnie FORBES Patient, Student-PA

## 2024-05-20 ENCOUNTER — Ambulatory Visit: Payer: Self-pay | Admitting: Physician Assistant

## 2024-05-20 DIAGNOSIS — E7800 Pure hypercholesterolemia, unspecified: Secondary | ICD-10-CM | POA: Insufficient documentation

## 2024-05-20 LAB — CBC WITH DIFFERENTIAL/PLATELET
Basophils Absolute: 0.1 x10E3/uL (ref 0.0–0.2)
Basos: 2 %
EOS (ABSOLUTE): 0.4 x10E3/uL (ref 0.0–0.4)
Eos: 6 %
Hematocrit: 38.3 % (ref 34.0–46.6)
Hemoglobin: 11.7 g/dL (ref 11.1–15.9)
Immature Grans (Abs): 0 x10E3/uL (ref 0.0–0.1)
Immature Granulocytes: 0 %
Lymphocytes Absolute: 2.2 x10E3/uL (ref 0.7–3.1)
Lymphs: 37 %
MCH: 29.7 pg (ref 26.6–33.0)
MCHC: 30.5 g/dL — ABNORMAL LOW (ref 31.5–35.7)
MCV: 97 fL (ref 79–97)
Monocytes Absolute: 0.7 x10E3/uL (ref 0.1–0.9)
Monocytes: 12 %
Neutrophils Absolute: 2.6 x10E3/uL (ref 1.4–7.0)
Neutrophils: 42 %
Platelets: 239 x10E3/uL (ref 150–450)
RBC: 3.94 x10E6/uL (ref 3.77–5.28)
RDW: 12.1 % (ref 11.7–15.4)
WBC: 6 x10E3/uL (ref 3.4–10.8)

## 2024-05-20 LAB — LIPID PANEL
Chol/HDL Ratio: 3 ratio (ref 0.0–4.4)
Cholesterol, Total: 209 mg/dL — ABNORMAL HIGH (ref 100–199)
HDL: 70 mg/dL (ref >39)
LDL Chol Calc (NIH): 122 mg/dL — ABNORMAL HIGH (ref 0–99)
Triglycerides: 98 mg/dL (ref 0–149)
VLDL Cholesterol Cal: 17 mg/dL (ref 5–40)

## 2024-05-20 LAB — CMP14+EGFR
ALT: 11 IU/L (ref 0–32)
AST: 20 IU/L (ref 0–40)
Albumin: 4.1 g/dL (ref 3.9–4.9)
Alkaline Phosphatase: 81 IU/L (ref 44–121)
BUN/Creatinine Ratio: 18 (ref 9–23)
BUN: 14 mg/dL (ref 6–24)
Bilirubin Total: 0.3 mg/dL (ref 0.0–1.2)
CO2: 21 mmol/L (ref 20–29)
Calcium: 9 mg/dL (ref 8.7–10.2)
Chloride: 101 mmol/L (ref 96–106)
Creatinine, Ser: 0.78 mg/dL (ref 0.57–1.00)
Globulin, Total: 2.4 g/dL (ref 1.5–4.5)
Glucose: 88 mg/dL (ref 70–99)
Potassium: 4.3 mmol/L (ref 3.5–5.2)
Sodium: 137 mmol/L (ref 134–144)
Total Protein: 6.5 g/dL (ref 6.0–8.5)
eGFR: 95 mL/min/1.73 (ref >59)

## 2024-05-20 LAB — TSH: TSH: 2.12 u[IU]/mL (ref 0.450–4.500)

## 2024-05-20 LAB — VITAMIN D 25 HYDROXY (VIT D DEFICIENCY, FRACTURES): Vit D, 25-Hydroxy: 32.9 ng/mL (ref 30.0–100.0)

## 2024-05-20 NOTE — Progress Notes (Signed)
 Amanda Ferrell,   Vitamin D  is low normal. I would increase vitamin D  OTC by 1000 units and take with dairy for better absorption.  Thyroid looks good.  Kidney, liver, glucose looks good.  HDL, good cholesterol, looks GREAT.  LDL, bad cholesterol, not to optimal goal.   Your overall 10 year cardiovascular risk is low which is good. Continue to work on regular exercise and low fat/fried foot/fatty diet. Recheck in 1 year.   Amanda Ferrell.The 10-year ASCVD risk score (Arnett DK, et al., 2019) is: 0.7%   Values used to calculate the score:     Age: 48 years     Clincally relevant sex: Female     Is Non-Hispanic African American: No     Diabetic: No     Tobacco smoker: No     Systolic Blood Pressure: 135 mmHg     Is BP treated: No     HDL Cholesterol: 70 mg/dL     Total Cholesterol: 209 mg/dL

## 2024-05-21 ENCOUNTER — Ambulatory Visit

## 2024-06-12 ENCOUNTER — Ambulatory Visit

## 2024-06-12 DIAGNOSIS — Z1231 Encounter for screening mammogram for malignant neoplasm of breast: Secondary | ICD-10-CM

## 2024-06-16 ENCOUNTER — Ambulatory Visit: Payer: Self-pay | Admitting: Physician Assistant

## 2024-06-16 NOTE — Progress Notes (Signed)
 Normal mammogram. Follow up in 1 year.

## 2024-06-26 ENCOUNTER — Encounter: Payer: Self-pay | Admitting: Physician Assistant

## 2024-06-27 MED ORDER — SPIRONOLACTONE 25 MG PO TABS: 25.0000 mg | ORAL_TABLET | Freq: Every day | ORAL | 1 refills | Status: AC

## 2024-07-08 ENCOUNTER — Encounter: Payer: Self-pay | Admitting: Sports Medicine

## 2024-07-21 ENCOUNTER — Ambulatory Visit: Admitting: Physician Assistant

## 2024-07-28 ENCOUNTER — Ambulatory Visit: Admitting: Physician Assistant

## 2024-08-05 ENCOUNTER — Ambulatory Visit: Admitting: Physician Assistant

## 2024-08-05 ENCOUNTER — Encounter: Payer: Self-pay | Admitting: Physician Assistant

## 2024-08-05 VITALS — BP 100/57 | HR 65 | Ht 67.0 in | Wt 208.0 lb

## 2024-08-05 DIAGNOSIS — L659 Nonscarring hair loss, unspecified: Secondary | ICD-10-CM | POA: Diagnosis not present

## 2024-08-05 DIAGNOSIS — Z79899 Other long term (current) drug therapy: Secondary | ICD-10-CM | POA: Diagnosis not present

## 2024-08-05 NOTE — Progress Notes (Signed)
   Established Patient Office Visit  Subjective   Patient ID: Amanda Ferrell, female    DOB: [birth year]  Age: 48 y.o. MRN: [id number]  Chief Complaint  Patient presents with   Medical Management of Chronic Issues    HPI Discussed the use of AI scribe software for clinical note transcription with the patient, who gave verbal consent to proceed.  History of Present Illness Amanda Ferrell is a 48 year old female who presents for follow-up regarding her spironolactone  for hair thinning.   Hair thinning - Current medication regimen for three to four weeks - Slight improvement in hair thinning - Currently on the lowest dose of medication  Adverse effects of medication - No dizziness - No symptoms of hypotension - No breast enlargement  Blood pressure - No symptoms of low blood pressure - Low blood pressure reading at appointment possibly related to lack of fluid intake prior to visit    ROS See HPI.    Objective:     BP (!) 100/57 (BP Location: Right Arm, Patient Position: Sitting)   Pulse 65   Ht 5' 7 (1.702 m)   Wt 208 lb (94.3 kg)   SpO2 99%   BMI 32.58 kg/m  BP Readings from Last 3 Encounters:  08/05/24 (!) 100/57  05/19/24 135/75  12/28/23 100/66   Wt Readings from Last 3 Encounters:  08/05/24 208 lb (94.3 kg)  05/19/24 205 lb (93 kg)  12/28/23 200 lb 8 oz (90.9 kg)      Physical Exam Constitutional:      Appearance: Normal appearance.  HENT:     Head: Normocephalic.     Comments: No patches of hair loss but some diffuse thinning over top of scalp. Cardiovascular:     Rate and Rhythm: Normal rate and regular rhythm.  Pulmonary:     Effort: Pulmonary effort is normal.     Breath sounds: Normal breath sounds.  Neurological:     General: No focal deficit present.     Mental Status: She is alert and oriented to person, place, and time.  Psychiatric:        Mood and Affect: Mood normal.      The 10-year ASCVD risk score (Arnett DK, et al.,  2019) is: 0.4%    Assessment & Plan:  .Amanda Ferrell was seen today for medical management of chronic issues.  Diagnoses and all orders for this visit:  Hair thinning  Medication management -     BMP8+eGFR   Assessment & Plan Hair thinning Discussed potential side effects of spironolactone  including breast enlargement. - Continue current dose of spironolactone  since hair growth improving.  - Monitor for side effects such as breast enlargement. - Discuss potential dose increase to 50 mg if more efficacy is desired. - Check laboratory tests for electrolyte imbalances.  Hypotension Low blood pressure likely due to spironolactone 's diuretic effect and possible low fluid intake. Asymptomatic. - Monitor blood pressure regularly. - Advise to increase salt intake if needed.     Amanda Garver, PA-C

## 2024-08-05 NOTE — Patient Instructions (Signed)
Spironolactone Tablets What is this medication? SPIRONOLACTONE ([place] LAK tone) treats high blood pressure and heart failure. It may also be used to reduce swelling related to heart, kidney, or liver disease. It helps your kidneys remove more fluid and salt from your blood through the urine without losing too much potassium. It belongs to a group of medications called diuretics. This medicine may be used for other purposes; ask your health care provider or pharmacist if you have questions. COMMON BRAND NAME(S): Aldactone What should I tell my care team before I take this medication? They need to know if you have any of these conditions: Addison's disease or low adrenal gland function High blood level of potassium Kidney disease Liver disease An unusual or allergic reaction to spironolactone, other medications, foods, dyes, or preservatives Pregnant or trying to get pregnant Breast-feeding How should I use this medication? Take this medication by mouth. Take it as directed on the prescription label at the same time every day. You can take it with or without food. You should always take it the same way. Keep taking it unless your care team tells you to stop. Talk to your care team about the use of this medication in children. Special care may be needed. Overdosage: If you think you have taken too much of this medicine contact a poison control center or emergency room at once. NOTE: This medicine is only for you. Do not share this medicine with others. What if I miss a dose? If you miss a dose, take it as soon as you can. If it is almost time for your next dose, take only that dose. Do not take double or extra doses. What may interact with this medication? Do not take this medication with any of the following: Cidofovir Eplerenone Tranylcypromine This medication may also interact with the following: Aspirin Certain medications for blood pressure or heart disease, such as benazepril,  lisinopril, losartan, valsartan Certain medications that prevent or treat blood clots, such as heparin and enoxaparin Cholestyramine Cyclosporine Digoxin Lithium Medications that relax muscles for surgery NSAIDs, medications for pain and inflammation, such as ibuprofen or naproxen Other diuretics Potassium salts or supplements Steroid medications, such as prednisone or cortisone Trimethoprim This list may not describe all possible interactions. Give your health care provider a list of all the medicines, herbs, non-prescription drugs, or dietary supplements you use. Also tell them if you smoke, drink alcohol, or use illegal drugs. Some items may interact with your medicine. What should I watch for while using this medication? Visit your care team for regular checks on your progress. Check your blood pressure as directed. Know what your blood pressure should be and when to contact your care team. Do not treat yourself for coughs, colds, or pain while you are using this medication without asking your care team for advice. Some medications may increase your blood pressure. Check with your care team if you have severe diarrhea, nausea, and vomiting, or if you sweat a lot. The loss of too much body fluid may make it dangerous for you to take this medication. You may need to be on a special diet while you are taking this medication. Ask your care team. Also, find out how many glasses of fluids you need to drink each day. This medication may affect your coordination, reaction time, or judgment. Do not drive or operate machinery until you know how this medication affects you. Sit up or stand slowly to reduce the risk of dizzy or fainting  spells. Drinking alcohol with this medication can increase the risk of these side effects. Avoid salt substitutes unless you are told otherwise by your care team. What side effects may I notice from receiving this medication? Side effects that you should report to your  care team as soon as possible: Allergic reactions--skin rash, itching, hives, swelling of the face, lips, tongue, or throat Dehydration--increased thirst, dry mouth, feeling faint or lightheaded, headache, dark yellow or brown urine High potassium level--muscle weakness, fast or irregular heartbeat Kidney injury--decrease in the amount of urine, swelling of the ankles, hands, or feet Low blood pressure--dizziness, feeling faint or lightheaded, blurry vision Low sodium level--muscle weakness, fatigue, dizziness, headache, confusion Side effects that usually do not require medical attention (report to your care team if they continue or are bothersome): Breast pain or tenderness Changes in sex drive or performance Dizziness Headache Irregular menstrual cycles or spotting Unexpected breast tissue growth This list may not describe all possible side effects. Call your doctor for medical advice about side effects. You may report side effects to FDA at 1-800-FDA-1088. Where should I keep my medication? Keep out of the reach of children and pets. Store below 25 degrees C (77 degrees F). Get rid of any unused medication after the expiration date. To get rid of medications that are no longer needed or have expired: Take the medication to a medication take-back program. Check with your pharmacy or law enforcement to find a location. If you cannot return the medication, check the label or package insert to see if the medication should be thrown out in the garbage or flushed down the toilet. If you are not sure, ask your care team. If it is safe to put into the trash, take the medication out of the container. Mix the medication with cat litter, dirt, coffee grounds, or other unwanted substance. Seal the mixture in a bag or container. Put it in the trash. NOTE: This sheet is a summary. It may not cover all possible information. If you have questions about this medicine, talk to your doctor, pharmacist, or  health care provider.  2024 [employer] (2022-05-11 00:00:00)

## 2024-08-06 ENCOUNTER — Ambulatory Visit: Payer: Self-pay | Admitting: Physician Assistant

## 2024-08-06 LAB — BMP8+EGFR
BUN/Creatinine Ratio: 20 (ref 9–23)
BUN: 13 mg/dL (ref 6–24)
CO2: 23 mmol/L (ref 20–29)
Calcium: 9.1 mg/dL (ref 8.7–10.2)
Chloride: 102 mmol/L (ref 96–106)
Creatinine, Ser: 0.66 mg/dL (ref 0.57–1.00)
Glucose: 92 mg/dL (ref 70–99)
Potassium: 4.6 mmol/L (ref 3.5–5.2)
Sodium: 138 mmol/L (ref 134–144)
eGFR: 109 mL/min/1.73 (ref >59)

## 2024-08-06 NOTE — Progress Notes (Signed)
Potassium looks great.
